# Patient Record
Sex: Female | Born: 1970
Health system: Southern US, Community
[De-identification: ages and names within clinical notes are randomized; demographics above are authoritative.]

## PROBLEM LIST (undated history)

## (undated) DIAGNOSIS — R011 Cardiac murmur, unspecified: Secondary | ICD-10-CM

## (undated) DIAGNOSIS — I890 Lymphedema, not elsewhere classified: Secondary | ICD-10-CM

## (undated) DIAGNOSIS — R609 Edema, unspecified: Secondary | ICD-10-CM

## (undated) DIAGNOSIS — D219 Benign neoplasm of connective and other soft tissue, unspecified: Secondary | ICD-10-CM

## (undated) HISTORY — PX: UTERINE FIBROID SURGERY: SHX826

## (undated) HISTORY — PX: MYOMECTOMY: SHX85

---

## 1998-01-15 ENCOUNTER — Emergency Department (HOSPITAL_COMMUNITY): Admission: EM | Admit: 1998-01-15 | Discharge: 1998-01-15 | Payer: Self-pay | Admitting: Emergency Medicine

## 1998-08-12 ENCOUNTER — Emergency Department (HOSPITAL_COMMUNITY): Admission: EM | Admit: 1998-08-12 | Discharge: 1998-08-12 | Payer: Self-pay | Admitting: Emergency Medicine

## 1998-08-12 ENCOUNTER — Encounter: Payer: Self-pay | Admitting: *Deleted

## 1999-01-08 ENCOUNTER — Emergency Department (HOSPITAL_COMMUNITY): Admission: EM | Admit: 1999-01-08 | Discharge: 1999-01-08 | Payer: Self-pay | Admitting: Emergency Medicine

## 1999-01-09 ENCOUNTER — Encounter: Payer: Self-pay | Admitting: Emergency Medicine

## 2000-11-21 ENCOUNTER — Emergency Department (HOSPITAL_COMMUNITY): Admission: EM | Admit: 2000-11-21 | Discharge: 2000-11-22 | Payer: Self-pay | Admitting: Emergency Medicine

## 2004-04-17 ENCOUNTER — Emergency Department (HOSPITAL_COMMUNITY): Admission: EM | Admit: 2004-04-17 | Discharge: 2004-04-18 | Payer: Self-pay

## 2005-05-02 ENCOUNTER — Encounter (INDEPENDENT_AMBULATORY_CARE_PROVIDER_SITE_OTHER): Payer: Self-pay | Admitting: Specialist

## 2005-05-02 ENCOUNTER — Inpatient Hospital Stay (HOSPITAL_COMMUNITY): Admission: RE | Admit: 2005-05-02 | Discharge: 2005-05-04 | Payer: Self-pay | Admitting: Obstetrics and Gynecology

## 2006-08-19 ENCOUNTER — Emergency Department (HOSPITAL_COMMUNITY): Admission: EM | Admit: 2006-08-19 | Discharge: 2006-08-19 | Payer: Self-pay | Admitting: Family Medicine

## 2008-07-15 ENCOUNTER — Emergency Department (HOSPITAL_COMMUNITY): Admission: EM | Admit: 2008-07-15 | Discharge: 2008-07-15 | Payer: Self-pay | Admitting: Emergency Medicine

## 2009-06-17 ENCOUNTER — Inpatient Hospital Stay (HOSPITAL_COMMUNITY): Admission: AD | Admit: 2009-06-17 | Discharge: 2009-06-17 | Payer: Self-pay | Admitting: Obstetrics and Gynecology

## 2009-06-20 ENCOUNTER — Inpatient Hospital Stay (HOSPITAL_COMMUNITY): Admission: AD | Admit: 2009-06-20 | Discharge: 2009-06-20 | Payer: Self-pay | Admitting: Obstetrics & Gynecology

## 2009-06-24 ENCOUNTER — Inpatient Hospital Stay (HOSPITAL_COMMUNITY): Admission: AD | Admit: 2009-06-24 | Discharge: 2009-06-24 | Payer: Self-pay | Admitting: Obstetrics & Gynecology

## 2009-07-04 ENCOUNTER — Inpatient Hospital Stay (HOSPITAL_COMMUNITY): Admission: AD | Admit: 2009-07-04 | Discharge: 2009-07-05 | Payer: Self-pay | Admitting: Obstetrics and Gynecology

## 2009-10-25 ENCOUNTER — Emergency Department (HOSPITAL_COMMUNITY): Admission: EM | Admit: 2009-10-25 | Discharge: 2009-10-25 | Payer: Self-pay | Admitting: Emergency Medicine

## 2009-10-27 ENCOUNTER — Emergency Department (HOSPITAL_COMMUNITY): Admission: EM | Admit: 2009-10-27 | Discharge: 2009-10-27 | Payer: Self-pay | Admitting: Family Medicine

## 2009-10-27 ENCOUNTER — Emergency Department (HOSPITAL_COMMUNITY): Admission: EM | Admit: 2009-10-27 | Discharge: 2009-10-27 | Payer: Self-pay | Admitting: Emergency Medicine

## 2009-11-01 ENCOUNTER — Emergency Department (HOSPITAL_COMMUNITY): Admission: EM | Admit: 2009-11-01 | Discharge: 2009-11-01 | Payer: Self-pay | Admitting: Family Medicine

## 2010-02-03 ENCOUNTER — Inpatient Hospital Stay (HOSPITAL_COMMUNITY): Admission: AD | Admit: 2010-02-03 | Discharge: 2010-02-03 | Payer: Self-pay | Admitting: Obstetrics & Gynecology

## 2010-11-20 LAB — URINALYSIS, ROUTINE W REFLEX MICROSCOPIC
Bilirubin Urine: NEGATIVE
Ketones, ur: NEGATIVE mg/dL
Nitrite: NEGATIVE
pH: 6 (ref 5.0–8.0)

## 2010-11-22 LAB — POCT I-STAT, CHEM 8
Chloride: 103 mEq/L (ref 96–112)
Glucose, Bld: 105 mg/dL — ABNORMAL HIGH (ref 70–99)
HCT: 43 % (ref 36.0–46.0)
Potassium: 4.3 mEq/L (ref 3.5–5.1)
Sodium: 136 mEq/L (ref 135–145)

## 2010-11-22 LAB — POCT RAPID STREP A (OFFICE): Streptococcus, Group A Screen (Direct): NEGATIVE

## 2010-11-24 LAB — POCT RAPID STREP A (OFFICE): Streptococcus, Group A Screen (Direct): NEGATIVE

## 2010-12-06 LAB — CBC
Hemoglobin: 13.2 g/dL (ref 12.0–15.0)
MCHC: 33.3 g/dL (ref 30.0–36.0)
RBC: 4.59 MIL/uL (ref 3.87–5.11)
WBC: 4.7 10*3/uL (ref 4.0–10.5)

## 2010-12-07 LAB — URINALYSIS, ROUTINE W REFLEX MICROSCOPIC
Nitrite: NEGATIVE
Protein, ur: NEGATIVE mg/dL
Specific Gravity, Urine: 1.02 (ref 1.005–1.030)
Urobilinogen, UA: 0.2 mg/dL (ref 0.0–1.0)

## 2010-12-07 LAB — HCG, QUANTITATIVE, PREGNANCY
hCG, Beta Chain, Quant, S: 5432 m[IU]/mL — ABNORMAL HIGH (ref <5)
hCG, Beta Chain, Quant, S: 5715 m[IU]/mL — ABNORMAL HIGH (ref <5)

## 2010-12-07 LAB — CBC
Hemoglobin: 12.5 g/dL (ref 12.0–15.0)
MCHC: 33.3 g/dL (ref 30.0–36.0)
MCV: 86.5 fL (ref 78.0–100.0)
RBC: 4.33 MIL/uL (ref 3.87–5.11)
WBC: 4.3 10*3/uL (ref 4.0–10.5)

## 2010-12-07 LAB — ABO/RH: ABO/RH(D): AB POS

## 2010-12-07 LAB — POCT PREGNANCY, URINE: Preg Test, Ur: POSITIVE

## 2010-12-07 LAB — GC/CHLAMYDIA PROBE AMP, GENITAL: GC Probe Amp, Genital: NEGATIVE

## 2011-01-19 NOTE — H&P (Signed)
Debra Hutchinson, EISENMENGER NO.:  [id number]   MEDICAL RECORD NO.:  [id number]          PATIENT TYPE:  INP   LOCATION:  NA                            FACILITY:  [facility]   PHYSICIAN:  Lenoard Aden, M.D.DATE OF BIRTH:  12/14/1970   DATE OF ADMISSION:  05/02/2005  DATE OF DISCHARGE:                                HISTORY & PHYSICAL   CHIEF COMPLAINT:  Pelvic pain.   HISTORY OF PRESENT ILLNESS:  The patient is a 40 year old African-American  female, G0, P0, who presents with symptomatic uterine fibroids for abdominal  myomectomy. Ultrasound done previously revealed a 9 x 9 cm anterior uterine  wall fibroid and two smaller fibroids in the fundus measuring 2 x 2 and 1 x  1 cm, bilateral normal ovaries were previously noted. The patient has a  noncontributory pregnancy history.   She has a surgical history remarkable for a lumpectomy of a fibroadenoma in  1987. Family history of hypertension and diabetes. She is a nonsmoker and  nondrinker. Denies domestic or physical violence. Takes no medications and  has no known drug allergies. She has a history of an abnormal Pap smear with  CIN3 status post LEEP with excised CIN3 in June of this year.   PHYSICAL EXAMINATION:  GENERAL:  She is a well-developed, well-nourished  African-American female in no acute distress.  HEENT:  Normal.  LUNGS:  Clear.  HEART:  Regular rate and rhythm.  ABDOMEN:  Soft and nontender.  PELVIC:  Reveals some 10 to 12 week size uterus and no adnexal masses.  EXTREMITIES:  Reveal no cords.  NEUROLOGICAL:  Nonfocal.   IMPRESSION:  Symptomatic uterine fibroid for definitive management.   PLAN:  Proceed with abdominal myomectomy. Risks of anesthesia, infection,  bleeding, injury to abdominal organs, need for repair was discussed and  fertility risks were discussed with secondary adhesions noted. The patient  acknowledges and wishes to proceed.           ______________________________  Lenoard Aden, M.D.     RJT/MEDQ  D:  05/01/2005  T:  05/01/2005  Job:  [id number]

## 2011-01-19 NOTE — Op Note (Signed)
Debra Hutchinson, NEAL NO.:  [id number]   MEDICAL RECORD NO.:  [id number]          PATIENT TYPE:  INP   LOCATION:  9399                          FACILITY:  [facility]   PHYSICIAN:  Lenoard Aden, M.D.DATE OF BIRTH:  [birth year]   DATE OF PROCEDURE:  05/02/2005  DATE OF DISCHARGE:                                 OPERATIVE REPORT   PREOPERATIVE DIAGNOSIS:  Symptomatic uterine fibroids.   POSTOPERATIVE DIAGNOSIS:  Symptomatic uterine fibroids.   PROCEDURES:  1.  Multiple abdominal myomectomy.  2.  Chromopertubation.   SURGEON:  Lenoard Aden, M.D.   ASSISTANT:  Cordelia Pen A. Rosalio Macadamia, M.D.   ANESTHESIA:  General.   ESTIMATED BLOOD LOSS:  300 mL.   COMPLICATIONS:  None.   DRAINS:  Foley.   COUNTS:  Correct.   Patient to recovery in good condition.   BRIEF OPERATIVE NOTE:  After being apprised of the risks of anesthesia,  infection, bleeding, injury to abdominal organs and need for repair, delayed  versus immediate complications to include bowel and bladder injury, the  patient was brought to the operating room, where she was administered a  general anesthetic without complications, prepped and draped in the usual  sterile fashion, a Foley catheter placed, an acorn cannula placed per  vagina.  Exam under anesthesia reveals a 12-14 weeks' size uterus, no  adnexal masses.  At this time a Pfannenstiel skin incision made with a  scalpel, carried down to the fascia, which is nicked in the midline and  opened transversely using Mayo scissors.  Rectus muscles dissected sharply  in the midline, peritoneum entered sharply.  The peritoneal cavity is  entered sharply.  Visualization reveals normal tubes, normal ovaries, and a  large anterior fibroid measuring at least 10-12 cm.  The specimen is then  elevated through the incision.  A dilute Pitressin solution is placed.  A  vertical anterior incision is made over the fibroid, which is then extended  down to the  fibroid capsule.  The fibroid is then dissected sharply in its  entirety and removed in one piece, an approximately 10 cm fibroid.  The  cavity defect is closed using a 2-0 Vicryl and a 3-0 Vicryl, first in a  concentric running fashion and then in an interrupted figure-of-eight  fashion.  A 3-0 Monocryl is used to place a baseball stitch in the usual  fashion.  Good hemostasis is noted.  Then over the posterior wall the  fibroid is identified, Pitressin solution placed, an incision made with  needle-point cautery in a vertical fashion.  The fibroid is removed.  The  defect is closed using a 2-0 Vicryl and a 4-0 Monocryl baseball stitch.  Good hemostasis is noted.  The area is covered with Interceed.  Chromopertubation is then  performed, revealing bilateral tubal spillage and bilateral normal ovaries.  At this time irrigation is accomplished and good hemostasis is noted.  The  fascia is closed using a 0 Monocryl in continuous running fashion.  Skin  closed using staples.  The patient tolerates the procedure well and is  transferred to recovery in good condition.  Lenoard Aden, M.D.  Electronically Signed     RJT/MEDQ  D:  05/02/2005  T:  05/02/2005  Job:  [id number]

## 2011-01-19 NOTE — Discharge Summary (Signed)
Debra Hutchinson, Debra Hutchinson                ACCOUNT NO.:  [id number]   MEDICAL RECORD NO.:  [id number]          PATIENT TYPE:  INP   LOCATION:  9302                          FACILITY:  [facility]   PHYSICIAN:  Lenoard Aden, M.D.DATE OF BIRTH:  11-24-1970   DATE OF ADMISSION:  05/02/2005  DATE OF DISCHARGE:  05/04/2005                                 DISCHARGE SUMMARY   The patient underwent uncomplicated multiple abdominal myomectomy and  chromopertubation on May 02, 2005.  Postoperative course uncomplicated.  Tolerated a regular diet well.  Hemoglobin and hematocrit within normal  limits.  Discharged on day #2.  Percocet and iron given. Follow up in the  office in four to six weeks.  Incision care discussed.  Postoperative course  uncomplicated.      Lenoard Aden, M.D.  Electronically Signed     RJT/MEDQ  D:  07/01/2005  T:  07/02/2005  Job:  [id number]

## 2011-06-26 ENCOUNTER — Inpatient Hospital Stay (INDEPENDENT_AMBULATORY_CARE_PROVIDER_SITE_OTHER)
Admission: RE | Admit: 2011-06-26 | Discharge: 2011-06-26 | Disposition: A | Discharge status: Home / Self Care | Payer: 59 | Source: Ambulatory Visit | Attending: Family Medicine | Admitting: Family Medicine

## 2011-06-26 DIAGNOSIS — R109 Unspecified abdominal pain: Secondary | ICD-10-CM

## 2011-06-26 DIAGNOSIS — J309 Allergic rhinitis, unspecified: Secondary | ICD-10-CM

## 2011-06-26 LAB — POCT URINALYSIS DIP (DEVICE)
Bilirubin Urine: NEGATIVE
Ketones, ur: NEGATIVE mg/dL
Leukocytes, UA: NEGATIVE
Protein, ur: NEGATIVE mg/dL
Specific Gravity, Urine: 1.01 (ref 1.005–1.030)
pH: 7 (ref 5.0–8.0)

## 2011-06-26 LAB — POCT I-STAT, CHEM 8
BUN: 8 mg/dL (ref 6–23)
Calcium, Ion: 1.13 mmol/L (ref 1.12–1.32)
Chloride: 99 mEq/L (ref 96–112)
HCT: 38 % (ref 36.0–46.0)
Potassium: 3.4 mEq/L — ABNORMAL LOW (ref 3.5–5.1)

## 2011-06-26 LAB — POCT PREGNANCY, URINE: Preg Test, Ur: NEGATIVE

## 2011-08-31 ENCOUNTER — Emergency Department (INDEPENDENT_AMBULATORY_CARE_PROVIDER_SITE_OTHER)
Admission: EM | Admit: 2011-08-31 | Discharge: 2011-08-31 | Disposition: A | Discharge status: Home / Self Care | Payer: 59 | Source: Home / Self Care

## 2011-08-31 DIAGNOSIS — J069 Acute upper respiratory infection, unspecified: Secondary | ICD-10-CM

## 2011-08-31 HISTORY — DX: Edema, unspecified: R60.9

## 2011-08-31 HISTORY — DX: Cardiac murmur, unspecified: R01.1

## 2011-08-31 MED ORDER — PROMETHAZINE-CODEINE 6.25-10 MG/5ML PO SYRP: ORAL_SOLUTION | ORAL | Status: AC

## 2011-08-31 NOTE — ED Notes (Signed)
C/o productive cough of yellow sputum, nasal congestion and sore throat for 3 days.  States temp of 99.1 at highest

## 2011-08-31 NOTE — ED Provider Notes (Signed)
History     CSN: [id number]  Arrival date & time 08/31/11  1102   None     Chief Complaint  Patient presents with  . Cough  . Sore Throat    (Consider location/radiation/quality/duration/timing/severity/associated sxs/prior treatment) HPI Comments: Pt states 3 days ago she developed sinus and nasal congestion. She took an over the counter decongestant and an over the counter pain reliever. She has a hx of sinus problems and thought it was starting again. The next day her sinuses were better, but she began coughing and had sore throat and low grade fever. Nasal congestion persists.T Max 99+. Cough is disruptive to sleep. She is taking otc cough medication. She is a Engineer, civil (consulting) at [employer] and states she has had a couple of influenza pts and is concerned she is developing the flu. She denies myalgias,dyspnea or wheezing.   Patient is a 40 y.o. female presenting with pharyngitis. The history is provided by the patient.  Sore Throat Pertinent negatives include no shortness of breath.    Past Medical History  Diagnosis Date  . Heart murmur   . Peripheral edema     Past Surgical History  Procedure Date  . Uterine fibroid surgery     No family history on file.  History  Substance Use Topics  . Smoking status: Never Smoker   . Smokeless tobacco: Not on file  . Alcohol Use: No    OB History    Grav Para Term Preterm Abortions TAB SAB Ect Mult Living                  Review of Systems  Constitutional: Positive for fever and chills.  HENT: Positive for congestion, sore throat and rhinorrhea. Negative for ear pain, postnasal drip and sinus pressure.   Respiratory: Positive for cough. Negative for shortness of breath and wheezing.     Allergies  Review of patient's allergies indicates no known allergies.  Home Medications   Current Outpatient Rx  Name Route Sig Dispense Refill  . PROMETHAZINE-CODEINE 6.25-10 MG/5ML PO SYRP  1-2 tsp every 6 hrs prn cough 120 mL 0    BP  145/91  Pulse 90  Temp(Src) 98.5 F (36.9 C) (Oral)  Resp 20  SpO2 98%  LMP 08/27/2011  Physical Exam  Nursing note and vitals reviewed. Constitutional: She appears well-developed and well-nourished. No distress.  HENT:  Head: Normocephalic and atraumatic.  Right Ear: Tympanic membrane, external ear and ear canal normal.  Left Ear: Tympanic membrane, external ear and ear canal normal.  Nose: Nose normal.  Mouth/Throat: Uvula is midline, oropharynx is clear and moist and mucous membranes are normal. No oropharyngeal exudate, posterior oropharyngeal edema or posterior oropharyngeal erythema.  Neck: Neck supple.  Cardiovascular: Normal rate, regular rhythm and normal heart sounds.   Pulmonary/Chest: Effort normal and breath sounds normal. No respiratory distress.  Lymphadenopathy:    She has no cervical adenopathy.  Neurological: She is alert.  Skin: Skin is warm and dry.  Psychiatric: She has a normal mood and affect.    ED Course  Procedures (including critical care time)  Labs Reviewed - No data to display No results found.   1. Acute URI       MDM  URI symptoms with low grade fever.         Melody Comas, [place] 08/31/11 1228

## 2011-08-31 NOTE — ED Provider Notes (Signed)
Medical screening examination/treatment/procedure(s) were performed by non-physician practitioner and as supervising physician I was immediately available for consultation/collaboration.  Rohaan Durnil   Durrel Mcnee, MD 08/31/11 1430 

## 2012-04-17 ENCOUNTER — Emergency Department (HOSPITAL_COMMUNITY)
Admission: EM | Admit: 2012-04-17 | Discharge: 2012-04-18 | Disposition: A | Discharge status: Home / Self Care | Payer: 59 | Attending: Emergency Medicine | Admitting: Emergency Medicine

## 2012-04-17 ENCOUNTER — Encounter (HOSPITAL_COMMUNITY): Payer: Self-pay | Admitting: Emergency Medicine

## 2012-04-17 DIAGNOSIS — B9689 Other specified bacterial agents as the cause of diseases classified elsewhere: Secondary | ICD-10-CM | POA: Insufficient documentation

## 2012-04-17 DIAGNOSIS — N760 Acute vaginitis: Secondary | ICD-10-CM | POA: Insufficient documentation

## 2012-04-17 DIAGNOSIS — R102 Pelvic and perineal pain: Secondary | ICD-10-CM

## 2012-04-17 DIAGNOSIS — D219 Benign neoplasm of connective and other soft tissue, unspecified: Secondary | ICD-10-CM

## 2012-04-17 DIAGNOSIS — N938 Other specified abnormal uterine and vaginal bleeding: Secondary | ICD-10-CM

## 2012-04-17 DIAGNOSIS — D509 Iron deficiency anemia, unspecified: Secondary | ICD-10-CM | POA: Insufficient documentation

## 2012-04-17 DIAGNOSIS — A499 Bacterial infection, unspecified: Secondary | ICD-10-CM | POA: Insufficient documentation

## 2012-04-17 HISTORY — DX: Lymphedema, not elsewhere classified: I89.0

## 2012-04-17 HISTORY — DX: Benign neoplasm of connective and other soft tissue, unspecified: D21.9

## 2012-04-17 LAB — CBC WITH DIFFERENTIAL/PLATELET
Eosinophils Relative: 4 % (ref 0–5)
Lymphocytes Relative: 32 % (ref 12–46)
Lymphs Abs: 1.4 10*3/uL (ref 0.7–4.0)
MCV: 71.7 fL — ABNORMAL LOW (ref 78.0–100.0)
Neutrophils Relative %: 53 % (ref 43–77)
Platelets: 330 10*3/uL (ref 150–400)
RBC: 4.53 MIL/uL (ref 3.87–5.11)
WBC: 4.4 10*3/uL (ref 4.0–10.5)

## 2012-04-17 NOTE — ED Notes (Signed)
PT. REPORTS LLQ PAIN WITH SLIGHT NAUSEA/ CHILLS ONSET LAST Wednesday , DENIES VOMITTING OR DIARRHEA. NO FEVER . PT. STATES 2ND DAY OF HER MENSTRUAL PERIOD. PT. ADDED HISTORY OF FIBROIDS / MYOMECTOMY.

## 2012-04-18 ENCOUNTER — Emergency Department (HOSPITAL_COMMUNITY): Payer: 59

## 2012-04-18 LAB — WET PREP, GENITAL
Trich, Wet Prep: NONE SEEN
Yeast Wet Prep HPF POC: NONE SEEN

## 2012-04-18 LAB — BASIC METABOLIC PANEL
CO2: 28 mEq/L (ref 19–32)
Chloride: 104 mEq/L (ref 96–112)
Glucose, Bld: 91 mg/dL (ref 70–99)
Potassium: 3.6 mEq/L (ref 3.5–5.1)
Sodium: 141 mEq/L (ref 135–145)

## 2012-04-18 LAB — URINALYSIS, ROUTINE W REFLEX MICROSCOPIC
Glucose, UA: NEGATIVE mg/dL
Hgb urine dipstick: NEGATIVE
Specific Gravity, Urine: 1.039 — ABNORMAL HIGH (ref 1.005–1.030)
Urobilinogen, UA: 1 mg/dL (ref 0.0–1.0)

## 2012-04-18 MED ORDER — METRONIDAZOLE 500 MG PO TABS: 500.0000 mg | ORAL_TABLET | Freq: Two times a day (BID) | ORAL | Status: AC

## 2012-04-18 MED ORDER — HYDROCODONE-ACETAMINOPHEN 5-325 MG PO TABS
2.0000 | ORAL_TABLET | Freq: Once | ORAL | Status: AC
  Administered 2012-04-18: 2 via ORAL
  Filled 2012-04-18: qty 2

## 2012-04-18 MED ORDER — HYDROCODONE-ACETAMINOPHEN 5-500 MG PO TABS: 1.0000 | ORAL_TABLET | Freq: Four times a day (QID) | ORAL | Status: AC | PRN

## 2012-04-18 NOTE — ED Notes (Signed)
Patient has returned from ultrasound.

## 2012-04-18 NOTE — ED Notes (Signed)
Patient transported to Ultrasound 

## 2012-04-18 NOTE — ED Provider Notes (Signed)
History     CSN: [id number]  Arrival date & time 04/17/12  2314   First MD Initiated Contact with Patient 04/18/12 0028      Chief Complaint  Patient presents with  . Abdominal Pain    (Consider location/radiation/quality/duration/timing/severity/associated sxs/prior treatment) Patient is a 41 y.o. female presenting with abdominal pain. The history is provided by the patient.  Abdominal Pain The primary symptoms of the illness include abdominal pain and vaginal bleeding. The primary symptoms of the illness do not include fever, shortness of breath, dysuria or vaginal discharge.  pt c/o 'heavy period' on day 2, changing tampon q 2 hrs.  Also w lower abd cramping, low back cramping c/w prior periods. Hx uterine fibroids, was told needed surgery for same. No hx ovarian cyst or endometriosis. No hx diverticula. No vaginal discharge. No dysuria or urgency. No fever or chills. No abd distension. Normal appetite. No nv. Having normal bms, no constipation or diarrhea.   Past Medical History  Diagnosis Date  . Heart murmur   . Peripheral edema   . Lymphedema   . Fibroids     Past Surgical History  Procedure Date  . Uterine fibroid surgery   . Myomectomy     No family history on file.  History  Substance Use Topics  . Smoking status: Never Smoker   . Smokeless tobacco: Not on file  . Alcohol Use: No    OB History    Grav Para Term Preterm Abortions TAB SAB Ect Mult Living                  Review of Systems  Constitutional: Negative for fever.  Respiratory: Negative for shortness of breath.   Gastrointestinal: Positive for abdominal pain.  Genitourinary: Positive for vaginal bleeding. Negative for dysuria and vaginal discharge.  Neurological: Negative for syncope and light-headedness.    Allergies  Review of patient's allergies indicates no known allergies.  Home Medications   Current Outpatient Rx  Name Route Sig Dispense Refill  . FERROUS SULFATE 325 (65 FE) MG  PO TABS Oral Take 325 mg by mouth daily with breakfast.    . IBUPROFEN 200 MG PO TABS Oral Take 800 mg by mouth every 6 (six) hours as needed. For pain      BP 133/87  Pulse 67  Temp 98.1 F (36.7 C) (Oral)  Resp 16  SpO2 100%  LMP 04/15/2012  Physical Exam  Nursing note and vitals reviewed. Constitutional: She is oriented to person, place, and time. She appears well-developed and well-nourished. No distress.  Eyes: Conjunctivae are normal. No scleral icterus.  Neck: Neck supple. No tracheal deviation present.  Cardiovascular: Normal rate.   Pulmonary/Chest: Effort normal. No respiratory distress.  Abdominal: Soft. Normal appearance and bowel sounds are normal. She exhibits no distension and no mass. There is no tenderness. There is no rebound and no guarding.  Genitourinary:       No cva tenderness. Scant amount dark blood in vagina. No active bleeding. Cervix closed. No cmt. Uterus enlarged, ?fibroids.   Musculoskeletal: She exhibits no edema.  Neurological: She is alert and oriented to person, place, and time.  Skin: Skin is warm and dry. No rash noted. No pallor.  Psychiatric: She has a normal mood and affect.    ED Course  Procedures (including critical care time)  Results for orders placed during the hospital encounter of 04/17/12  URINALYSIS, ROUTINE W REFLEX MICROSCOPIC      Component Value Range  Color, Urine YELLOW  YELLOW   APPearance CLEAR  CLEAR   Specific Gravity, Urine 1.039 (*) 1.005 - 1.030   pH 5.5  5.0 - 8.0   Glucose, UA NEGATIVE  NEGATIVE mg/dL   Hgb urine dipstick NEGATIVE  NEGATIVE   Bilirubin Urine NEGATIVE  NEGATIVE   Ketones, ur NEGATIVE  NEGATIVE mg/dL   Protein, ur NEGATIVE  NEGATIVE mg/dL   Urobilinogen, UA 1.0  0.0 - 1.0 mg/dL   Nitrite NEGATIVE  NEGATIVE   Leukocytes, UA NEGATIVE  NEGATIVE  CBC WITH DIFFERENTIAL      Component Value Range   WBC 4.4  4.0 - 10.5 K/uL   RBC 4.53  3.87 - 5.11 MIL/uL   Hemoglobin 10.3 (*) 12.0 - 15.0 g/dL     HCT 16.1 (*) 09.6 - 46.0 %   MCV 71.7 (*) 78.0 - 100.0 fL   MCH 22.7 (*) 26.0 - 34.0 pg   MCHC 31.7  30.0 - 36.0 g/dL   RDW 04.5  40.9 - 81.1 %   Platelets 330  150 - 400 K/uL   Neutrophils Relative 53  43 - 77 %   Neutro Abs 2.3  1.7 - 7.7 K/uL   Lymphocytes Relative 32  12 - 46 %   Lymphs Abs 1.4  0.7 - 4.0 K/uL   Monocytes Relative 11  3 - 12 %   Monocytes Absolute 0.5  0.1 - 1.0 K/uL   Eosinophils Relative 4  0 - 5 %   Eosinophils Absolute 0.2  0.0 - 0.7 K/uL   Basophils Relative 1  0 - 1 %   Basophils Absolute 0.0  0.0 - 0.1 K/uL  BASIC METABOLIC PANEL      Component Value Range   Sodium 141  135 - 145 mEq/L   Potassium 3.6  3.5 - 5.1 mEq/L   Chloride 104  96 - 112 mEq/L   CO2 28  19 - 32 mEq/L   Glucose, Bld 91  70 - 99 mg/dL   BUN 11  6 - 23 mg/dL   Creatinine, Ser 9.14  0.50 - 1.10 mg/dL   Calcium 8.9  8.4 - 78.2 mg/dL   GFR calc non Af Amer >90  >90 mL/min   GFR calc Af Amer >90  >90 mL/min  POCT PREGNANCY, URINE      Component Value Range   Preg Test, Ur NEGATIVE  NEGATIVE  WET PREP, GENITAL      Component Value Range   Yeast Wet Prep HPF POC NONE SEEN  NONE SEEN   Trich, Wet Prep NONE SEEN  NONE SEEN   Clue Cells Wet Prep HPF POC FEW (*) NONE SEEN   WBC, Wet Prep HPF POC FEW (*) NONE SEEN   US Pelvis Complete  04/18/2012  *RADIOLOGY REPORT*  Clinical Data: Left pelvic pain.  Ovarian cyst versus fibroid.  TRANSABDOMINAL ULTRASOUND OF PELVIS  Technique:  Transabdominal ultrasound examination of the pelvis was performed including evaluation of the uterus, ovaries, adnexal regions, and pelvic cul-de-sac.  Comparison:  None.  Findings:  Uterus:  16.3 cm x 12 cm x 10.7 cm.  Multiple uterine fibroids are present.  The largest are fundal in location, ranging in size from fibroids are fundal in location.  Multiple fibroids range up to 6.8 cm in size.  Endometrium: Not visualized.  Right ovary: Not visualized due to the enlarged uterus and fibroids.  Left ovary: Not  visualized due to enlarged uterus and fibroids.  Other Findings:  Transvaginal  exam was not performed due to pelvic pain.  IMPRESSION: Fibroid uterus with multiple large fibroids that produce suboptimal sonographic evaluation of the anatomic pelvis.  No free fluid identified.  Original Report Authenticated By: Andreas Newport, M.D.        MDM  Labs.   Mild anemia. Microcystic. Pt on iron.   States going through tampon q 2 hours, c/w other prior recent heavy periods.  rec gyn follow up.   Discussed u/s w pt. abd soft nt on recheck.   Discussed need close gyn follow up - pt states has seen Dr Rosemary Holms in past and will follow up there.  No heavy/recurrent bleeding in ed.   Pt does note mild vaginal discharge recently in between periods/vaginal bleeding.  Clue cells on wet prep, will rx bv.       Suzi Roots, MD 04/18/12 [phone]

## 2012-04-19 LAB — GC/CHLAMYDIA PROBE AMP, GENITAL: GC Probe Amp, Genital: NEGATIVE

## 2012-06-13 ENCOUNTER — Emergency Department (HOSPITAL_COMMUNITY)
Admission: EM | Admit: 2012-06-13 | Discharge: 2012-06-13 | Disposition: A | Discharge status: Home / Self Care | Payer: 59 | Source: Home / Self Care

## 2012-06-13 ENCOUNTER — Encounter (HOSPITAL_COMMUNITY): Payer: Self-pay | Admitting: *Deleted

## 2012-06-13 DIAGNOSIS — R52 Pain, unspecified: Secondary | ICD-10-CM

## 2012-06-13 DIAGNOSIS — R3129 Other microscopic hematuria: Secondary | ICD-10-CM

## 2012-06-13 DIAGNOSIS — M549 Dorsalgia, unspecified: Secondary | ICD-10-CM

## 2012-06-13 DIAGNOSIS — R109 Unspecified abdominal pain: Secondary | ICD-10-CM

## 2012-06-13 LAB — POCT URINALYSIS DIP (DEVICE)
Leukocytes, UA: NEGATIVE
Nitrite: NEGATIVE
Protein, ur: NEGATIVE mg/dL
pH: 6 (ref 5.0–8.0)

## 2012-06-13 MED ORDER — ONDANSETRON HCL 4 MG/2ML IJ SOLN
4.0000 mg | Freq: Once | INTRAMUSCULAR | Status: AC
  Administered 2012-06-13: 4 mg via INTRAMUSCULAR

## 2012-06-13 MED ORDER — ONDANSETRON HCL 4 MG/2ML IJ SOLN
INTRAMUSCULAR | Status: AC
  Filled 2012-06-13: qty 2

## 2012-06-13 NOTE — ED Notes (Signed)
Pt  Reports  r  Flank  Pain   With  Nausea     Since  2   Am         Not  releived  By  otc  meds         She   Reports the  Pain is  Between  8-10    Pain  Scale        She  denys  Any  History    She  denys  Any  Recent  Injury

## 2012-06-13 NOTE — ED Provider Notes (Signed)
History     CSN: [id number]  Arrival date & time 06/13/12  4098   None     Chief Complaint  Patient presents with  . 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 The pain is progressed but not radiating. It is located her right flank/right low back. She feels like there is a fist to her back. she tends to pace with the pain. Associated symptoms include nausea. Urine is trace microscopic hematuria. She does not have a history of kidney stones.   Patient is a 41 y.o. female presenting with flank pain.  Flank Pain Pertinent negatives include no abdominal pain.    Past Medical History  Diagnosis Date  . Heart murmur   . Peripheral edema   . Lymphedema   . Fibroids     Past Surgical History  Procedure Date  . Uterine fibroid surgery   . Myomectomy     No family history on file.  History  Substance Use Topics  . Smoking status: Never Smoker   . Smokeless tobacco: Not on file  . Alcohol Use: No    OB History    Grav Para Term Preterm Abortions TAB SAB Ect Mult Living                  Review of Systems  Constitutional: Negative for fever, diaphoresis and activity change.  HENT: Negative.   Respiratory: Negative.   Cardiovascular: Negative.   Gastrointestinal: Positive for nausea. Negative for vomiting and abdominal pain.  Genitourinary: Positive for flank pain. Negative for dysuria, urgency, decreased urine volume, vaginal pain, menstrual problem and pelvic pain.  Musculoskeletal: Positive for back pain. Negative for gait problem.  Neurological: Negative.     Allergies  Review of patient's allergies indicates no known allergies.  Home Medications   Current Outpatient Rx  Name Route Sig Dispense Refill  . ACETAMINOPHEN 325 MG PO TABS Oral Take 650 mg by  mouth every 6 (six) hours as needed.    Marland Kitchen FERROUS SULFATE 325 (65 FE) MG PO TABS Oral Take 325 mg by mouth daily with breakfast.    . IBUPROFEN 200 MG PO TABS Oral Take 800 mg by mouth every 6 (six) hours as needed. For pain      BP 150/83  Pulse 76  Temp 98.3 F (36.8 C) (Oral)  Resp 20  SpO2 100%  LMP 06/13/2012  Physical Exam  Constitutional: She is oriented to person, place, and time. She appears well-developed and well-nourished.  Eyes: Conjunctivae normal and EOM are normal.  Neck: Normal range of motion. Neck supple.  Cardiovascular: Normal rate, regular rhythm and normal heart sounds.   Pulmonary/Chest: Effort normal and breath sounds normal.  Abdominal: Soft. There is no tenderness.  Musculoskeletal: She exhibits no edema.       Marked tenderness in a single-point of the right lower back musculature.  Neurological: She is alert and oriented to person, place, and time. No cranial nerve deficit.  Skin: Skin is warm and dry.    ED Course  Procedures (including critical care time)  Labs Reviewed  POCT URINALYSIS DIP (DEVICE) - Abnormal; Notable for the following:    Hgb urine dipstick TRACE (*)     All other components within normal limits  POCT PREGNANCY, URINE   No results found.   1. Flank pain, acute  2. Hematuria, microscopic   3. Back pain       MDM   Results for orders placed during the hospital encounter of 06/13/12  POCT URINALYSIS DIP (DEVICE)      Component Value Range   Glucose, UA NEGATIVE  NEGATIVE mg/dL   Bilirubin Urine NEGATIVE  NEGATIVE   Ketones, ur NEGATIVE  NEGATIVE mg/dL   Specific Gravity, Urine 1.015  1.005 - 1.030   Hgb urine dipstick TRACE (*) NEGATIVE   pH 6.0  5.0 - 8.0   Protein, ur NEGATIVE  NEGATIVE mg/dL   Urobilinogen, UA 0.2  0.0 - 1.0 mg/dL   Nitrite NEGATIVE  NEGATIVE   Leukocytes, UA NEGATIVE  NEGATIVE  POCT PREGNANCY, URINE      Component Value Range   Preg Test, Ur NEGATIVE  NEGATIVE   The type of pain that  she has her right flank is similar to that of a colicky pain due to ureteral calculus. However, she does have a choline size area of marked right low back muscle tenderness, which confuses the diagnosis somewhat. Of course, there could be 2 conditions concomitantly. A CT urogram would be best diagnostic tool at this point. She does not want to go to the emergency department. Arrangements have been made to [facility] for her to go there at 2 PM. She has hydrocodone for pain at home but it makes her nauseated so I will give her an injection of Zofran 4 mg IM now and that she revealed to tolerate the Vicodin. Her husband will take her to the urologist this afternoon. Toradol was considered however if this is a stone and it is greater than 7 mm Toradol would be contraindicated.        Hayden Rasmussen, NP 06/13/12 [phone]

## 2012-06-14 NOTE — ED Provider Notes (Signed)
Medical screening examination/treatment/procedure(s) were performed by resident physician or non-physician practitioner and as supervising physician I was immediately available for consultation/collaboration.   Barkley Bruns MD.    Linna Hoff, MD 06/14/12 [phone]

## 2012-07-02 ENCOUNTER — Other Ambulatory Visit: Payer: Self-pay | Admitting: Obstetrics and Gynecology

## 2012-07-02 DIAGNOSIS — D649 Anemia, unspecified: Secondary | ICD-10-CM

## 2012-07-02 DIAGNOSIS — D259 Leiomyoma of uterus, unspecified: Secondary | ICD-10-CM

## 2012-07-02 DIAGNOSIS — N946 Dysmenorrhea, unspecified: Secondary | ICD-10-CM

## 2012-07-08 ENCOUNTER — Other Ambulatory Visit: Payer: 59

## 2013-06-26 ENCOUNTER — Encounter (HOSPITAL_COMMUNITY): Payer: Self-pay | Admitting: Emergency Medicine

## 2013-06-26 ENCOUNTER — Emergency Department (HOSPITAL_COMMUNITY)
Admission: EM | Admit: 2013-06-26 | Discharge: 2013-06-26 | Disposition: A | Discharge status: Home / Self Care | Payer: 59 | Source: Home / Self Care | Attending: Family Medicine | Admitting: Family Medicine

## 2013-06-26 ENCOUNTER — Ambulatory Visit
Admission: RE | Admit: 2013-06-26 | Discharge: 2013-06-26 | Disposition: A | Discharge status: Home / Self Care | Payer: 59 | Source: Ambulatory Visit | Attending: Family Medicine | Admitting: Family Medicine

## 2013-06-26 DIAGNOSIS — N920 Excessive and frequent menstruation with regular cycle: Secondary | ICD-10-CM

## 2013-06-26 DIAGNOSIS — M7989 Other specified soft tissue disorders: Secondary | ICD-10-CM

## 2013-06-26 DIAGNOSIS — D509 Iron deficiency anemia, unspecified: Secondary | ICD-10-CM

## 2013-06-26 LAB — POCT I-STAT, CHEM 8
Calcium, Ion: 1.09 mmol/L — ABNORMAL LOW (ref 1.12–1.23)
Chloride: 104 mEq/L (ref 96–112)
HCT: 26 % — ABNORMAL LOW (ref 36.0–46.0)
Hemoglobin: 8.8 g/dL — ABNORMAL LOW (ref 12.0–15.0)
Potassium: 3.6 mEq/L (ref 3.5–5.1)

## 2013-06-26 LAB — CBC
MCH: 16.7 pg — ABNORMAL LOW (ref 26.0–34.0)
MCHC: 28.3 g/dL — ABNORMAL LOW (ref 30.0–36.0)
Platelets: 337 10*3/uL (ref 150–400)

## 2013-06-26 LAB — IRON AND TIBC: Iron: <10 ug/dL — ABNORMAL LOW (ref 42–135)

## 2013-06-26 MED ORDER — FUROSEMIDE 20 MG PO TABS: 20.0000 mg | ORAL_TABLET | Freq: Every day | ORAL | Status: AC

## 2013-06-26 MED ORDER — DOXYCYCLINE HYCLATE 100 MG PO CAPS: 100.0000 mg | ORAL_CAPSULE | Freq: Two times a day (BID) | ORAL | Status: DC

## 2013-06-26 NOTE — ED Notes (Signed)
C/o lower calf pain left leg. Started off as vericose vein but has gradually gotten worse. Pain and swelling. Tender to touch.  Used ibuprofen with no relief. No hx of DVTS.  Hx of chronic swelling/edema.  Request hgb check due to heavy cylcles also feels light headed at times.

## 2013-06-26 NOTE — ED Provider Notes (Signed)
Debra Hutchinson is a 42 y.o. female who presents to Urgent Care today for  1) left lower leg pain and swelling. Patient has a history of bilateral lower extremity swelling worse with prolonged standing and better with rest. However over the past several weeks she's noted a mildly tender spot on the medial calf. He became more indurated and more painful over the past week. She notes he became moderately painful recently. She denies any fevers or chills or chest pains or trouble breathing. She's not tried any medications. No history of DVT or recent immobility.   2) anemia: Patient suspects that she has anemia as she has heavy menstrual bleeding. Her periods typically last 10 days and she has used to pain positive time and a pad and has to change them every 2-3 hours. She is currently seeing an OB/GYN Dr. for consideration of endometrial ablation versus hysterectomy. She would like to know her hemoglobin as is she suspect she may be anemic. She is taking iron daily.     Past Medical History  Diagnosis Date  . Heart murmur   . Peripheral edema   . Lymphedema   . Fibroids    History  Substance Use Topics  . Smoking status: Never Smoker   . Smokeless tobacco: Not on file  . Alcohol Use: No   ROS as above Medications reviewed. No current facility-administered medications for this encounter.   Current Outpatient Prescriptions  Medication Sig Dispense Refill  . ibuprofen (ADVIL,MOTRIN) 200 MG tablet Take 800 mg by mouth every 6 (six) hours as needed. For pain      . acetaminophen (TYLENOL) 325 MG tablet Take 650 mg by mouth every 6 (six) hours as needed.      . doxycycline (VIBRAMYCIN) 100 MG capsule Take 1 capsule (100 mg total) by mouth 2 (two) times daily.  20 capsule  0  . ferrous sulfate 325 (65 FE) MG tablet Take 325 mg by mouth daily with breakfast.      . furosemide (LASIX) 20 MG tablet Take 1 tablet (20 mg total) by mouth daily.  30 tablet  1    Exam:  BP 126/73  Pulse 75   Temp(Src) 98.2 F (36.8 C) (Oral)  Resp 18  SpO2 100%  LMP 06/22/2013 Gen: Well NAD HEENT: EOMI,  MMM Lungs: CTABL Nl WOB Heart: RRR no MRG Abd: NABS, NT, ND Left lower extremity: 1+ pitting edema. Area of induration and tenderness approximately 3 cm in diameter at the medial aspect of her mid tibia.  There is a small area of fluctuance with mild tenderness in the center of induration.  Right lower extremity 1+ pitting edema Calf diameters are 48 cm bilaterally  Results for orders placed during the hospital encounter of 06/26/13 (from the past 24 hour(s))  POCT I-STAT, CHEM 8     Status: Abnormal   Collection Time    06/26/13  9:30 AM      Result Value Range   Sodium 140  135 - 145 mEq/L   Potassium 3.6  3.5 - 5.1 mEq/L   Chloride 104  96 - 112 mEq/L   BUN 18  6 - 23 mg/dL   Creatinine, Ser 1.61  0.50 - 1.10 mg/dL   Glucose, Bld 096 (*) 70 - 99 mg/dL   Calcium, Ion 0.45 (*) 1.12 - 1.23 mmol/L   TCO2 21  0 - 100 mmol/L   Hemoglobin 8.8 (*) 12.0 - 15.0 g/dL   HCT 40.9 (*) 81.1 - 91.4 %  Attempted aspiration and drainage of possible abscess :  Consent obtained  Area cleaned with alcohol cold spray applied and 2 mL of lidocaine were injected into the area of fluctuance.  A 21-gauge needle was used to aspirate frank blood.  The procedure was discontinued at this time .  Limited musculoskeletal ultrasound of the left lower leg:  Ultrasound probe placed over area of fluctuance. A 1 cm cystic structure was seen. No blood flow noted on Doppler. The indurated tissue surrounding the area of fluctuance was ultrasounded and found to be marbled appearance consistent with cellulitis or edema.  The ultrasound probe was then placed in a posterior knee and compressible veins were identified.  No bony abnormality.    Assessment and Plan: 42 y.o. female with  1) left lower leg swelling and tenderness:  Patient has bilateral dependent edema which will be discussed below.  The area that  initially file is an abscess appears to be a varicose vein. Frank blood was aspirated. I am culturing the blood to ensure no cellulitis and am treating with doxycycline empirically.  Additionally I will obtain a left lower extremity Doppler ultrasound to evaluate for DVT.  Will call patient with result.  2) bilateral lower extremity edema: Likely dependent edema. Patient has a normal creatinine. We'll start low-dose Lasix. Additionally patient will followup with her primary care provider. She may benefit from echocardiogram in the near future.  3) anemia: Patient has hemoglobin of 8.8 with a history of heavy menstrual bleeding. Lab findings to assess iron stores are pending. Plan to increase oral iron to 3 times daily if tolerated.  4) heavy menstrual bleeding: Followup with OB/GYN ASAP  Discussed warning signs or symptoms. Please see discharge instructions. Patient expresses understanding.      Rodolph Bong, MD 06/26/13 1002

## 2013-06-28 ENCOUNTER — Telehealth (HOSPITAL_COMMUNITY): Payer: Self-pay | Admitting: Family Medicine

## 2013-06-28 NOTE — ED Notes (Signed)
Called patient regarding her iron studies.  She is extremely iron deficient.  Plan: Start oral iron tid with Vit c ASAP.  I will investigate IV iron today and will call patient back with plan to start IV iron this week.   Rodolph Bong, MD 06/28/13 [phone]

## 2013-06-28 NOTE — Telephone Encounter (Signed)
Message copied by Rodolph Bong on Sun Jun 28, 2013  8:54 AM ------      Message from: Vassie Moselle      Created: Fri Jun 26, 2013  5:12 PM      Regarding: labs       Iron abnormal.      ----- Message -----         From: Lab In [place] Interface         Sent: 06/26/2013   9:56 AM           To: [facility] Follow Up                   ------

## 2013-06-28 NOTE — Telephone Encounter (Signed)
Message copied by Aleiah Mohammed S on Sun Jun 28, 2013  8:54 AM ------      Message from: YORK, SUZANNE M      Created: Fri Jun 26, 2013  5:12 PM      Regarding: labs       Iron abnormal.      ----- Message -----         From: Lab In [facility] Interface         Sent: 06/26/2013   9:56 AM           To: [facility] Follow Up                   ------ 

## 2013-06-29 LAB — CULTURE, ROUTINE-ABSCESS: Gram Stain: NONE SEEN

## 2013-06-30 ENCOUNTER — Encounter (HOSPITAL_COMMUNITY): Payer: Self-pay | Admitting: Emergency Medicine

## 2013-06-30 ENCOUNTER — Telehealth (HOSPITAL_COMMUNITY): Payer: Self-pay | Admitting: Family Medicine

## 2013-06-30 ENCOUNTER — Emergency Department (HOSPITAL_COMMUNITY)
Admission: EM | Admit: 2013-06-30 | Discharge: 2013-07-01 | Disposition: A | Discharge status: Home / Self Care | Payer: 59 | Attending: Emergency Medicine | Admitting: Emergency Medicine

## 2013-06-30 DIAGNOSIS — R0602 Shortness of breath: Secondary | ICD-10-CM | POA: Insufficient documentation

## 2013-06-30 DIAGNOSIS — D649 Anemia, unspecified: Secondary | ICD-10-CM | POA: Insufficient documentation

## 2013-06-30 DIAGNOSIS — R55 Syncope and collapse: Secondary | ICD-10-CM | POA: Insufficient documentation

## 2013-06-30 DIAGNOSIS — M7989 Other specified soft tissue disorders: Secondary | ICD-10-CM | POA: Insufficient documentation

## 2013-06-30 DIAGNOSIS — R531 Weakness: Secondary | ICD-10-CM

## 2013-06-30 DIAGNOSIS — Z79899 Other long term (current) drug therapy: Secondary | ICD-10-CM | POA: Insufficient documentation

## 2013-06-30 DIAGNOSIS — R002 Palpitations: Secondary | ICD-10-CM | POA: Insufficient documentation

## 2013-06-30 DIAGNOSIS — E876 Hypokalemia: Secondary | ICD-10-CM | POA: Insufficient documentation

## 2013-06-30 DIAGNOSIS — R011 Cardiac murmur, unspecified: Secondary | ICD-10-CM | POA: Insufficient documentation

## 2013-06-30 DIAGNOSIS — Z8679 Personal history of other diseases of the circulatory system: Secondary | ICD-10-CM | POA: Insufficient documentation

## 2013-06-30 DIAGNOSIS — Z792 Long term (current) use of antibiotics: Secondary | ICD-10-CM | POA: Insufficient documentation

## 2013-06-30 DIAGNOSIS — Z8742 Personal history of other diseases of the female genital tract: Secondary | ICD-10-CM | POA: Insufficient documentation

## 2013-06-30 DIAGNOSIS — R42 Dizziness and giddiness: Secondary | ICD-10-CM | POA: Insufficient documentation

## 2013-06-30 LAB — POCT I-STAT, CHEM 8
BUN: 19 mg/dL (ref 6–23)
Calcium, Ion: 1.19 mmol/L (ref 1.12–1.23)
Chloride: 101 mEq/L (ref 96–112)
Creatinine, Ser: 1 mg/dL (ref 0.50–1.10)
Glucose, Bld: 98 mg/dL (ref 70–99)
Sodium: 142 mEq/L (ref 135–145)
TCO2: 25 mmol/L (ref 0–100)

## 2013-06-30 LAB — CBC WITH DIFFERENTIAL/PLATELET
Basophils Absolute: 0.1 10*3/uL (ref 0.0–0.1)
Basophils Relative: 2 % — ABNORMAL HIGH (ref 0–1)
Eosinophils Absolute: 0.1 10*3/uL (ref 0.0–0.7)
Eosinophils Relative: 4 % (ref 0–5)
HCT: 27 % — ABNORMAL LOW (ref 36.0–46.0)
Hemoglobin: 7.8 g/dL — ABNORMAL LOW (ref 12.0–15.0)
MCH: 16.7 pg — ABNORMAL LOW (ref 26.0–34.0)
MCHC: 28.9 g/dL — ABNORMAL LOW (ref 30.0–36.0)
MCV: 57.8 fL — ABNORMAL LOW (ref 78.0–100.0)
Monocytes Absolute: 0.5 10*3/uL (ref 0.1–1.0)
Monocytes Relative: 14 % — ABNORMAL HIGH (ref 3–12)
RDW: 19.4 % — ABNORMAL HIGH (ref 11.5–15.5)

## 2013-06-30 LAB — BASIC METABOLIC PANEL
BUN: 19 mg/dL (ref 6–23)
CO2: 26 mEq/L (ref 19–32)
Calcium: 9.3 mg/dL (ref 8.4–10.5)
Chloride: 100 mEq/L (ref 96–112)
Creatinine, Ser: 0.81 mg/dL (ref 0.50–1.10)
GFR calc Af Amer: >90 mL/min (ref >90)
Glucose, Bld: 92 mg/dL (ref 70–99)

## 2013-06-30 MED ORDER — SODIUM CHLORIDE 0.9 % IV BOLUS (SEPSIS)
1000.0000 mL | Freq: Once | INTRAVENOUS | Status: AC
  Administered 2013-06-30: 1000 mL via INTRAVENOUS

## 2013-06-30 MED ORDER — POTASSIUM CHLORIDE CRYS ER 20 MEQ PO TBCR
10.0000 meq | EXTENDED_RELEASE_TABLET | Freq: Once | ORAL | Status: AC
  Administered 2013-06-30: 10 meq via ORAL
  Filled 2013-06-30: qty 0.5

## 2013-06-30 MED ORDER — POTASSIUM CHLORIDE 10 MEQ/100ML IV SOLN
10.0000 meq | Freq: Once | INTRAVENOUS | Status: AC
  Administered 2013-06-30: 10 meq via INTRAVENOUS
  Filled 2013-06-30: qty 100

## 2013-06-30 NOTE — ED Provider Notes (Signed)
CSN: [id number]     Arrival date & time 06/30/13  1735 History   First MD Initiated Contact with Patient 06/30/13 1839     Chief Complaint  Patient presents with  . Near Syncope  . Anemia   (Consider location/radiation/quality/duration/timing/severity/associated sxs/prior Treatment) HPI Patient is a 42 year old female who presents to emergency department for complaint of dizziness, weakness, near syncopal episode. Patient also states that she is having some shortness of breath and palpitations. Patient states she was diagnosed with anemia several days ago while at urgent care where she was told her hemoglobin level is 7. Patient is going to be set up for outpatient transfusion and iron transfusion however this has not been done yet. Patient states that gradually her symptoms is worsening and she states that now she has had several near syncopal episode. States these episodes are mainly when she's standing up from sitting position. Denies complete loss of consciousness at any point. She denies any chest pain. States she gets tired and weak on exertion. Patient states that she has very heavy menstrual periods and she is set up to see someone regarding this. Patient states that she was also started on Lasix last week for dependent edema which she has had for "years" states that she feels like her dizziness and her symptoms worsened since starting on Lasix. Patient states she has been taking Lasix for a week, 20 mg daily.  Past Medical History  Diagnosis Date  . Heart murmur   . Peripheral edema   . Lymphedema   . Fibroids    Past Surgical History  Procedure Laterality Date  . Uterine fibroid surgery    . Myomectomy     History reviewed. No pertinent family history. History  Substance Use Topics  . Smoking status: Never Smoker   . Smokeless tobacco: Not on file  . Alcohol Use: No   OB History   Grav Para Term Preterm Abortions TAB SAB Ect Mult Living                 Review of Systems   Constitutional: Positive for fatigue. Negative for fever and chills.  Respiratory: Positive for shortness of breath. Negative for cough and chest tightness.   Cardiovascular: Positive for palpitations and leg swelling. Negative for chest pain.  Gastrointestinal: Negative for nausea, vomiting, abdominal pain and diarrhea.  Genitourinary: Negative for dysuria, flank pain, vaginal bleeding, vaginal discharge, vaginal pain and pelvic pain.  Musculoskeletal: Negative for arthralgias, myalgias, neck pain and neck stiffness.  Skin: Negative for rash.  Neurological: Positive for dizziness, weakness and light-headedness. Negative for headaches.  All other systems reviewed and are negative.    Allergies  Review of patient's allergies indicates no known allergies.  Home Medications   Current Outpatient Rx  Name  Route  Sig  Dispense  Refill  . albuterol (PROVENTIL HFA;VENTOLIN HFA) 108 (90 BASE) MCG/ACT inhaler   Inhalation   Inhale 2 puffs into the lungs every 6 (six) hours as needed for shortness of breath.         . doxycycline (VIBRAMYCIN) 100 MG capsule   Oral   Take 100 mg by mouth 2 (two) times daily. Patient started on 06-26-13         . ferrous sulfate 325 (65 FE) MG tablet   Oral   Take 325 mg by mouth daily with breakfast.         . furosemide (LASIX) 20 MG tablet   Oral   Take 1 tablet (20  mg total) by mouth daily.   30 tablet   1   . ibuprofen (ADVIL,MOTRIN) 800 MG tablet   Oral   Take 800 mg by mouth every 8 (eight) hours as needed for pain.          BP 118/77  Pulse 78  Temp(Src) 99.3 F (37.4 C) (Oral)  Resp 20  Wt 232 lb 5 oz (105.376 kg)  SpO2 100%  LMP 06/30/2013 Physical Exam  Nursing note and vitals reviewed. Constitutional: She is oriented to person, place, and time. She appears well-developed and well-nourished. No distress.  HENT:  Head: Normocephalic.  Eyes: Conjunctivae and EOM are normal. Pupils are equal, round, and reactive to light.   Neck: Normal range of motion. Neck supple.  Cardiovascular: Normal rate, regular rhythm and normal heart sounds.   Pulmonary/Chest: Effort normal and breath sounds normal. No respiratory distress. She has no wheezes. She has no rales.  Abdominal: Soft. Bowel sounds are normal. She exhibits no distension. There is no tenderness. There is no rebound.  Musculoskeletal: She exhibits edema.  1+ pitting edema bilaterally. No calf swelling or tenderness. Dorsal pedal pulses are equal and intact bilaterally  Neurological: She is alert and oriented to person, place, and time. No cranial nerve deficit. Coordination normal.  Skin: Skin is warm and dry.  Psychiatric: She has a normal mood and affect. Her behavior is normal.    ED Course  Procedures (including critical care time) Labs Review Labs Reviewed  CBC WITH DIFFERENTIAL - Abnormal; Notable for the following:    WBC 3.2 (*)    Hemoglobin 7.8 (*)    HCT 27.0 (*)    MCV 57.8 (*)    MCH 16.7 (*)    MCHC 28.9 (*)    RDW 19.4 (*)    Neutrophils Relative % 39 (*)    Neutro Abs 1.3 (*)    Monocytes Relative 14 (*)    Basophils Relative 2 (*)    All other components within normal limits  BASIC METABOLIC PANEL - Abnormal; Notable for the following:    Potassium 3.1 (*)    GFR calc non Af Amer 90 (*)    All other components within normal limits  POCT I-STAT, CHEM 8 - Abnormal; Notable for the following:    Potassium 2.6 (*)    Hemoglobin 10.9 (*)    HCT 32.0 (*)    All other components within normal limits  TYPE AND SCREEN  PREPARE RBC (CROSSMATCH)  ABO/RH   Imaging Review No results found.  EKG Interpretation   None       MDM   1. Anemia   2. Hypokalemia   3. Weakness     Patient with weakness, dizziness, near syncope. Recently diagnosed with anemia. Already has outpatient iron transfusion and blood transfusion set up however her symptoms worsened today. She also started Lasix recently for chronic lower extremity  edema. Blood work in emergency department showed hemoglobin of 7.8. Potassium 3.1. i have replaced pt 's potassium with , and IV. Pt is not orthostatic, VS normal, non toxic here. Probably chronic anemia. OFfered a unit transfusion in ED vs follow up for outpatient transfusion. Pt requested a unit here due to severe symptoms.  1:41 AM 1 unit of PRBC transfused. Follow up outpatient. Iron supplements recommended.   Filed Vitals:   06/30/13 2230 06/30/13 2300 07/01/13 0000 07/01/13 0100  BP: 131/69 129/69 105/58 115/63  Pulse: 88 92 87 85  Temp: 98.9 F (37.2 C)  98.6 F (37 C) 98.6 F (37 C) 98.8 F (37.1 C)  TempSrc: Oral Oral Oral Oral  Resp: 7 19 20 19   Weight:      SpO2: 100% 100% 98% 100%      Lottie Mussel, PA-C 07/01/13 0144

## 2013-06-30 NOTE — ED Notes (Addendum)
Pt reports going to [facility] on Friday for left lower leg cellulitis. Was also seen for palpitations and heavy menstrual cycles. Pt was told she is anemic with hgb of 7 and told to call if any symptoms returned prior to outpatient blood transfusion being arranged. Pt reports going to work last night and had multiple episodes of near syncopal episodes and palpitations. Reports blood loss is related to heavy menstrual cycles and thinks near syncope may be related to recently being started on lasix.

## 2013-06-30 NOTE — ED Notes (Signed)
Notified pharmacy that unable to locate tablet of K in any pyxis, [facility] pharmd to send via tube station

## 2013-06-30 NOTE — ED Notes (Signed)
I attempted to arrange for outpatient IV iron. Patient call back on Tuesday noted that she is actually feeling worse with more shortness of breath or palpitations. At this point I believe that she needs a blood transfusion. I discussed the case the patient. She will present to [facility] emergency room ASAP for evaluation and management and possible blood transfusion. She expresses understanding and agreement  Rodolph Bong, MD 06/30/13 [phone]

## 2013-06-30 NOTE — ED Notes (Signed)
Phlebotomy at bedside to draw Type and screen

## 2013-07-01 LAB — TYPE AND SCREEN
ABO/RH(D): AB POS
Unit division: 0

## 2013-07-01 NOTE — ED Provider Notes (Signed)
Medical screening examination/treatment/procedure(s) were conducted as a shared visit with non-physician practitioner(s) or resident  and myself.  I personally evaluated the patient during the encounter and agree with the findings and plan unless otherwise indicated.    I have personally reviewed any xrays and/ or EKG's with the provider and I agree with interpretation.   Chronic anemia, worsening due to fibroids/ vaginal bleeding and Fe Deficiency.   Pt is symptomatic, lightheaded/ sob with exertion with multiple presyncope episodes.  Pt has not received blood before.  Exam mild pale conjunctiva, no distress, no abd tenderness, mild edema LE bilateral, non tender.  CNs intact.  No recent surgery, no cp.  Long discussion regarding r/ b of blood transfusion and that the safest approach would be close outpt fup with Fe infusions with po Fe and then discussion with OB for fibroid treatment/ surgery as she has arranged.  Pt has had worsening sxs for sometime now and requests blood transfusion.  She understands risks of developing antibodies, HIV/ Hepatitis and other transfusion reactions.  She wishes to proceed.  Close fup discussed.  K given in ED, pt on low dose lasix, she will fup outpt with pcp for recheck of labs.   Anemia, Vaginal bleeding, Hypokalemia  Enid Skeens, MD 07/01/13 [phone]

## 2013-10-09 ENCOUNTER — Emergency Department (INDEPENDENT_AMBULATORY_CARE_PROVIDER_SITE_OTHER): Payer: 59

## 2013-10-09 ENCOUNTER — Emergency Department (HOSPITAL_COMMUNITY)
Admission: EM | Admit: 2013-10-09 | Discharge: 2013-10-09 | Disposition: A | Discharge status: Home / Self Care | Payer: 59 | Source: Home / Self Care | Attending: Family Medicine | Admitting: Family Medicine

## 2013-10-09 ENCOUNTER — Encounter (HOSPITAL_COMMUNITY): Payer: Self-pay | Admitting: Emergency Medicine

## 2013-10-09 DIAGNOSIS — S6390XA Sprain of unspecified part of unspecified wrist and hand, initial encounter: Secondary | ICD-10-CM

## 2013-10-09 DIAGNOSIS — S63619A Unspecified sprain of unspecified finger, initial encounter: Secondary | ICD-10-CM

## 2013-10-09 NOTE — ED Notes (Signed)
C/o left hand injury due to jamming hand into house door States ring finger has shooting pain and is tender States she is unable to make fist States movements hurt Did ice and rest hand as tx

## 2013-10-09 NOTE — Discharge Instructions (Signed)
Buddy tape for comfort , warm soak for soreness and swelling, return if further problems.

## 2013-10-09 NOTE — ED Provider Notes (Signed)
CSN: [id number]     Arrival date & time 10/09/13  0822 History   First MD Initiated Contact with Patient 10/09/13 [phone]     Chief Complaint  Patient presents with  . Hand Injury   (Consider location/radiation/quality/duration/timing/severity/associated sxs/prior Treatment) Patient is a 43 y.o. female presenting with hand injury. The history is provided by the patient.  Hand Injury Location:  Hand and finger Time since incident:  12 hours Injury: yes   Mechanism of injury comment:  Jammed going thru house door yest , pain to lrf. Hand location:  L hand Finger location:  L ring finger Pain details:    Quality:  Sharp   Severity:  Mild   Onset quality:  Gradual   Progression:  Unchanged Chronicity:  New Dislocation: no   Prior injury to area:  No Associated symptoms: stiffness   Associated symptoms: no numbness     Past Medical History  Diagnosis Date  . Heart murmur   . Peripheral edema   . Lymphedema   . Fibroids    Past Surgical History  Procedure Laterality Date  . Uterine fibroid surgery    . Myomectomy     History reviewed. No pertinent family history. History  Substance Use Topics  . Smoking status: Never Smoker   . Smokeless tobacco: Not on file  . Alcohol Use: No   OB History   Grav Para Term Preterm Abortions TAB SAB Ect Mult Living                 Review of Systems  Constitutional: Negative.   Musculoskeletal: Positive for joint swelling and stiffness.  Skin: Negative.     Allergies  Review of patient's allergies indicates no known allergies.  Home Medications   Current Outpatient Rx  Name  Route  Sig  Dispense  Refill  . albuterol (PROVENTIL HFA;VENTOLIN HFA) 108 (90 BASE) MCG/ACT inhaler   Inhalation   Inhale 2 puffs into the lungs every 6 (six) hours as needed for shortness of breath.         . doxycycline (VIBRAMYCIN) 100 MG capsule   Oral   Take 100 mg by mouth 2 (two) times daily. Patient started on 06-26-13         . ferrous  sulfate 325 (65 FE) MG tablet   Oral   Take 325 mg by mouth daily with breakfast.         . furosemide (LASIX) 20 MG tablet   Oral   Take 1 tablet (20 mg total) by mouth daily.   30 tablet   1   . ibuprofen (ADVIL,MOTRIN) 800 MG tablet   Oral   Take 800 mg by mouth every 8 (eight) hours as needed for pain.          BP 132/80  Pulse 80  Temp(Src) 98.6 F (37 C) (Oral)  Resp 16  SpO2 99%  LMP 09/27/2013 Physical Exam  Nursing note and vitals reviewed. Constitutional: She is oriented to person, place, and time. She appears well-developed and well-nourished.  Musculoskeletal: She exhibits tenderness.       Hands: Neurological: She is alert and oriented to person, place, and time.  Skin: Skin is warm and dry.    ED Course  Procedures (including critical care time) Labs Review Labs Reviewed - No data to display Imaging Review Dg Hand Complete Left  10/09/2013   ADDENDUM REPORT: 10/09/2013 09:49  ADDENDUM: Areas of concern for nondisplaced fractures are in the proximal fourth and fifth  metacarpals, not in the distal fourth and fifth metacarpals as well as noted in error in the impression portion of the report. The body of the report is correct.   Electronically Signed   By: Lowella Grip M.D.   On: 10/09/2013 09:49   10/09/2013   CLINICAL DATA:  Pain post trauma  EXAM: LEFT HAND - COMPLETE 3+ VIEW  COMPARISON:  None.  FINDINGS: Frontal, oblique, and lateral views were obtained. There are nondisplaced fractures of the proximal fourth and fifth metacarpals. There is also a nondisplaced fracture of the hamate bone. No other fractures. No dislocation. Joint spaces appear intact.  IMPRESSION: Nondisplaced fractures of the distal fourth and fifth metacarpals as well as the lateral hamate bone.  Electronically Signed: By: Lowella Grip M.D. On: 10/09/2013 09:21      MDM  X-rays reviewed and report per radiologist.     Billy Fischer, MD 10/09/13 [phone]

## 2015-04-01 ENCOUNTER — Emergency Department (HOSPITAL_COMMUNITY): Payer: 59

## 2015-04-01 ENCOUNTER — Encounter (HOSPITAL_COMMUNITY): Payer: Self-pay | Admitting: Emergency Medicine

## 2015-04-01 ENCOUNTER — Emergency Department (HOSPITAL_COMMUNITY)
Admission: EM | Admit: 2015-04-01 | Discharge: 2015-04-01 | Disposition: A | Discharge status: Home / Self Care | Payer: 59 | Attending: Emergency Medicine | Admitting: Emergency Medicine

## 2015-04-01 DIAGNOSIS — Z3202 Encounter for pregnancy test, result negative: Secondary | ICD-10-CM | POA: Insufficient documentation

## 2015-04-01 DIAGNOSIS — H6591 Unspecified nonsuppurative otitis media, right ear: Secondary | ICD-10-CM | POA: Diagnosis not present

## 2015-04-01 DIAGNOSIS — R011 Cardiac murmur, unspecified: Secondary | ICD-10-CM | POA: Insufficient documentation

## 2015-04-01 DIAGNOSIS — H748X2 Other specified disorders of left middle ear and mastoid: Secondary | ICD-10-CM | POA: Diagnosis not present

## 2015-04-01 DIAGNOSIS — J029 Acute pharyngitis, unspecified: Secondary | ICD-10-CM | POA: Insufficient documentation

## 2015-04-01 DIAGNOSIS — I10 Essential (primary) hypertension: Secondary | ICD-10-CM | POA: Insufficient documentation

## 2015-04-01 DIAGNOSIS — N938 Other specified abnormal uterine and vaginal bleeding: Secondary | ICD-10-CM | POA: Insufficient documentation

## 2015-04-01 DIAGNOSIS — Z86018 Personal history of other benign neoplasm: Secondary | ICD-10-CM | POA: Insufficient documentation

## 2015-04-01 DIAGNOSIS — Z79899 Other long term (current) drug therapy: Secondary | ICD-10-CM | POA: Insufficient documentation

## 2015-04-01 DIAGNOSIS — R42 Dizziness and giddiness: Secondary | ICD-10-CM | POA: Diagnosis present

## 2015-04-01 LAB — COMPREHENSIVE METABOLIC PANEL
ALBUMIN: 3.3 g/dL — AB (ref 3.5–5.0)
ALT: 29 U/L (ref 14–54)
ANION GAP: 8 (ref 5–15)
AST: 28 U/L (ref 15–41)
Alkaline Phosphatase: 69 U/L (ref 38–126)
BILIRUBIN TOTAL: 0.4 mg/dL (ref 0.3–1.2)
BUN: 6 mg/dL (ref 6–20)
CALCIUM: 8.8 mg/dL — AB (ref 8.9–10.3)
CHLORIDE: 103 mmol/L (ref 101–111)
CO2: 28 mmol/L (ref 22–32)
Creatinine, Ser: 0.68 mg/dL (ref 0.44–1.00)
GFR calc Af Amer: >60 mL/min (ref >60)
GFR calc non Af Amer: >60 mL/min (ref >60)
Glucose, Bld: 107 mg/dL — ABNORMAL HIGH (ref 65–99)
Potassium: 3.5 mmol/L (ref 3.5–5.1)
Sodium: 139 mmol/L (ref 135–145)
TOTAL PROTEIN: 6.9 g/dL (ref 6.5–8.1)

## 2015-04-01 LAB — CBC WITH DIFFERENTIAL/PLATELET
BASOS ABS: 0 10*3/uL (ref 0.0–0.1)
Basophils Relative: 0 % (ref 0–1)
EOS PCT: 3 % (ref 0–5)
Eosinophils Absolute: 0.2 10*3/uL (ref 0.0–0.7)
HCT: 28.4 % — ABNORMAL LOW (ref 36.0–46.0)
Hemoglobin: 8.3 g/dL — ABNORMAL LOW (ref 12.0–15.0)
Lymphocytes Relative: 14 % (ref 12–46)
Lymphs Abs: 1.1 10*3/uL (ref 0.7–4.0)
MCH: 18.8 pg — ABNORMAL LOW (ref 26.0–34.0)
MCHC: 29.2 g/dL — ABNORMAL LOW (ref 30.0–36.0)
MCV: 64.4 fL — ABNORMAL LOW (ref 78.0–100.0)
MONO ABS: 0.9 10*3/uL (ref 0.1–1.0)
MONOS PCT: 11 % (ref 3–12)
NEUTROS PCT: 72 % (ref 43–77)
Neutro Abs: 5.8 10*3/uL (ref 1.7–7.7)
Platelets: 289 10*3/uL (ref 150–400)
RBC: 4.41 MIL/uL (ref 3.87–5.11)
RDW: 18.6 % — AB (ref 11.5–15.5)
WBC: 8 10*3/uL (ref 4.0–10.5)

## 2015-04-01 LAB — URINE MICROSCOPIC-ADD ON

## 2015-04-01 LAB — URINALYSIS, ROUTINE W REFLEX MICROSCOPIC
Bilirubin Urine: NEGATIVE
Glucose, UA: NEGATIVE mg/dL
Ketones, ur: NEGATIVE mg/dL
Leukocytes, UA: NEGATIVE
Nitrite: NEGATIVE
Protein, ur: NEGATIVE mg/dL
SPECIFIC GRAVITY, URINE: 1.022 (ref 1.005–1.030)
Urobilinogen, UA: 4 mg/dL — ABNORMAL HIGH (ref 0.0–1.0)
pH: 7 (ref 5.0–8.0)

## 2015-04-01 LAB — POC URINE PREG, ED: PREG TEST UR: NEGATIVE

## 2015-04-01 LAB — RAPID STREP SCREEN (MED CTR MEBANE ONLY): Streptococcus, Group A Screen (Direct): NEGATIVE

## 2015-04-01 MED ORDER — HYDROCODONE-HOMATROPINE 5-1.5 MG/5ML PO SYRP: 5.0000 mL | ORAL_SOLUTION | Freq: Four times a day (QID) | ORAL | Status: AC | PRN

## 2015-04-01 MED ORDER — FERROUS SULFATE 325 (65 FE) MG PO TABS: 325.0000 mg | ORAL_TABLET | Freq: Every day | ORAL | Status: AC

## 2015-04-01 MED ORDER — AMOXICILLIN 500 MG PO CAPS
500.0000 mg | ORAL_CAPSULE | Freq: Once | ORAL | Status: AC
  Administered 2015-04-01: 500 mg via ORAL
  Filled 2015-04-01: qty 1

## 2015-04-01 MED ORDER — HYDROCODONE-HOMATROPINE 5-1.5 MG/5ML PO SYRP
5.0000 mL | ORAL_SOLUTION | Freq: Once | ORAL | Status: AC
  Administered 2015-04-01: 5 mL via ORAL
  Filled 2015-04-01: qty 5

## 2015-04-01 MED ORDER — DEXAMETHASONE 1 MG/ML PO CONC
10.0000 mg | Freq: Once | ORAL | Status: AC
  Administered 2015-04-01: 10 mg via ORAL
  Filled 2015-04-01: qty 10

## 2015-04-01 MED ORDER — AMOXICILLIN 500 MG PO CAPS: 500.0000 mg | ORAL_CAPSULE | Freq: Two times a day (BID) | ORAL | Status: AC

## 2015-04-01 MED ORDER — SODIUM CHLORIDE 0.9 % IV BOLUS (SEPSIS)
1000.0000 mL | Freq: Once | INTRAVENOUS | Status: AC
  Administered 2015-04-01: 1000 mL via INTRAVENOUS

## 2015-04-01 NOTE — ED Provider Notes (Signed)
CSN: [id number]     Arrival date & time 04/01/15  2004 History   First MD Initiated Contact with Patient 04/01/15 2117     Chief Complaint  Patient presents with  . Dizziness  . Sore Throat  . Fever  . Hypertension     (Consider location/radiation/quality/duration/timing/severity/associated sxs/prior Treatment) HPI Comments: Patient is a 44 year old female past medical history significant for peripheral edema, lymphedema, fibroids, dysfunctional uterine bleeding, anemia presenting to the emergency department for 2 complaints. Patient states since Saturday after attending a concert she felt as though her voice is gone. She states her throat is sore as well and has had a nonproductive cough with some subjective fever and chills. Patient states that around noon today she felt lightheaded and had dizzy spells. Patient states she was walking around at work she felt very flushed in her vision went in and out. She states she is currently on her menstrual cycle which is normally very heavy, unchanged today. She was supposed to have a hysterectomy to help with dysfunctional uterine bleeding causing anemia, and sold. She is no longer taking her iron supplements. Patient states she is feeling better currently. Denies any hematemesis, hemoptysis, melanotic or bright red blood per rectum.   Past Medical History  Diagnosis Date  . Heart murmur   . Peripheral edema   . Lymphedema   . Fibroids    Past Surgical History  Procedure Laterality Date  . Uterine fibroid surgery    . Myomectomy     No family history on file. History  Substance Use Topics  . Smoking status: Never Smoker   . Smokeless tobacco: Not on file  . Alcohol Use: No   OB History    No data available     Review of Systems  Constitutional: Positive for fever (subjective) and chills.  HENT: Positive for ear pain and sore throat. Negative for ear discharge.   Respiratory: Positive for cough. Negative for shortness of breath.    Cardiovascular: Negative for chest pain.  Gastrointestinal: Negative for nausea, vomiting and abdominal pain.  Genitourinary: Positive for vaginal bleeding.  Neurological: Positive for dizziness and light-headedness. Negative for numbness and headaches.  All other systems reviewed and are negative.     Allergies  Review of patient's allergies indicates no known allergies.  Home Medications   Prior to Admission medications   Medication Sig Start Date End Date Taking? Authorizing Provider  albuterol (PROVENTIL HFA;VENTOLIN HFA) 108 (90 BASE) MCG/ACT inhaler Inhale 2 puffs into the lungs every 6 (six) hours as needed for shortness of breath.   Yes Historical Provider, MD  BEE POLLEN PO Take 1 tablet by mouth daily.   Yes Historical Provider, MD  ibuprofen (ADVIL,MOTRIN) 800 MG tablet Take 800 mg by mouth every 8 (eight) hours as needed for pain.   Yes Historical Provider, MD  Multiple Vitamins-Minerals (MULTIVITAMIN GUMMIES WOMENS PO) Take 1 tablet by mouth daily.   Yes Historical Provider, MD  phentermine 37.5 MG capsule Take 37.5 mg by mouth every morning.   Yes Historical Provider, MD  amoxicillin (AMOXIL) 500 MG capsule Take 1 capsule (500 mg total) by mouth 2 (two) times daily. 04/01/15   Render Marley, PA-C  ferrous sulfate 325 (65 FE) MG tablet Take 1 tablet (325 mg total) by mouth daily. 04/01/15   Blade Scheff, PA-C  furosemide (LASIX) 20 MG tablet Take 1 tablet (20 mg total) by mouth daily. Patient not taking: Reported on 04/01/2015 06/26/13   Gregor Hams, MD  HYDROcodone-homatropine (HYCODAN) 5-1.5 MG/5ML syrup Take 5 mLs by mouth every 6 (six) hours as needed. 04/01/15   Kolbi Altadonna, PA-C   BP 151/92 mmHg  Pulse 90  Temp(Src) 98.5 F (36.9 C) (Oral)  Resp 20  Wt 257 lb (116.574 kg)  SpO2 100%  LMP 03/30/2015 Physical Exam  Constitutional: She is oriented to person, place, and time. She appears well-developed and well-nourished. No distress.  HENT:   Head: Normocephalic and atraumatic.  Right Ear: Hearing, external ear and ear canal normal. No mastoid tenderness. Tympanic membrane is erythematous. A middle ear effusion is present.  Left Ear: Hearing, external ear and ear canal normal. No mastoid tenderness. A middle ear effusion is present.  Nose: Nose normal.  Mouth/Throat: Uvula is midline and mucous membranes are normal. Posterior oropharyngeal erythema present. No oropharyngeal exudate, posterior oropharyngeal edema or tonsillar abscesses.  Eyes: Conjunctivae and EOM are normal. Pupils are equal, round, and reactive to light.  Neck: Normal range of motion. Neck supple.  Cardiovascular: Normal rate, regular rhythm, normal heart sounds and intact distal pulses.   Pulmonary/Chest: Effort normal and breath sounds normal. No respiratory distress.  Abdominal: Soft. There is no tenderness.  Neurological: She is alert and oriented to person, place, and time. She has normal strength. No cranial nerve deficit. Gait normal. GCS eye subscore is 4. GCS verbal subscore is 5. GCS motor subscore is 6.  Sensation grossly intact.  No pronator drift.  Bilateral heel-knee-shin intact.  Skin: Skin is warm and dry. She is not diaphoretic.  Nursing note and vitals reviewed.   ED Course  Procedures (including critical care time) Medications  sodium chloride 0.9 % bolus 1,000 mL (0 mLs Intravenous Stopped 04/01/15 2330)  dexamethasone (DECADRON) 1 MG/ML solution 10 mg (10 mg Oral Given 04/01/15 2331)  HYDROcodone-homatropine (HYCODAN) 5-1.5 MG/5ML syrup 5 mL (5 mLs Oral Given 04/01/15 2323)  amoxicillin (AMOXIL) capsule 500 mg (500 mg Oral Given 04/01/15 2323)    Labs Review Labs Reviewed  CBC WITH DIFFERENTIAL/PLATELET - Abnormal; Notable for the following:    Hemoglobin 8.3 (*)    HCT 28.4 (*)    MCV 64.4 (*)    MCH 18.8 (*)    MCHC 29.2 (*)    RDW 18.6 (*)    All other components within normal limits  COMPREHENSIVE METABOLIC PANEL - Abnormal;  Notable for the following:    Glucose, Bld 107 (*)    Calcium 8.8 (*)    Albumin 3.3 (*)    All other components within normal limits  URINALYSIS, ROUTINE W REFLEX MICROSCOPIC (NOT AT [facility]) - Abnormal; Notable for the following:    Hgb urine dipstick SMALL (*)    Urobilinogen, UA 4.0 (*)    All other components within normal limits  RAPID STREP SCREEN (NOT AT [facility])  CULTURE, GROUP A STREP  URINE MICROSCOPIC-ADD ON  POC URINE PREG, ED    Imaging Review Dg Chest 2 View  04/01/2015   CLINICAL DATA:  Cough, shortness of breath.  EXAM: CHEST  2 VIEW  COMPARISON:  None.  FINDINGS: The heart size and mediastinal contours are within normal limits. Both lungs are clear. The visualized skeletal structures are unremarkable.  IMPRESSION: No active cardiopulmonary disease.   Electronically Signed   By: Rolm Baptise M.D.   On: 04/01/2015 22:01     EKG Interpretation   Date/Time:  Friday April 01 2015 22:17:41 EDT Ventricular Rate:  91 PR Interval:  129 QRS Duration: 84 QT Interval:  346  QTC Calculation: 426 R Axis:   54 Text Interpretation:  Sinus rhythm ED PHYSICIAN INTERPRETATION AVAILABLE  IN [facility] Confirmed by TEST, Record ([id number]) on 04/02/2015 2:10:55  PM      MDM   Final diagnoses:  Dysfunctional uterine bleeding  Right otitis media with effusion    Filed Vitals:   04/01/15 2319  BP: 151/92  Pulse: 90  Temp:   Resp:    Afebrile, NAD, non-toxic appearing, AAOx4.   I have reviewed nursing notes, vital signs, and all lab and imaging results as noted above.  Patient presenting to the emergency department with multiple complaints. Examination no neurofocal deficits appreciated. Oropharynx is clear without trismus or uvula deviation. No cervical adenopathy. No mastoid tenderness or swelling. Right TM is erythematous without light reflex. Lungs clear to auscultation bilaterally. Cardiac exam is normal. Abdomen is soft, nontender, nondistended. Patient history of  dysfunctional uterine bleeding, currently on her menstrual cycle and not taking her iron supplements, likely source of lightheadedness. Will treat with amoxicillin for otitis media. Will start patient back on iron supplements in for follow-up with her OB/GYN for further management and evaluation. Will discharge patient home. Return precautions discussed. Patient is to plan and stable at time of discharge. Patient d/w with Dr. Ralene Bathe, agrees with plan.        Baron Sane, PA-C 04/03/15 [id number]  Quintella Reichert, MD 04/04/15 [phone]

## 2015-04-01 NOTE — ED Notes (Signed)
Pt A&Ox4, ambulatory at d/c with steady gait and states she has all of her belongings with her and is not missing anything.

## 2015-04-01 NOTE — Discharge Instructions (Signed)
Please follow up with your primary care physician in 1-2 days. If you do not have one please call the [facility] number listed above. Please follow up with your Ob/Gyn to schedule a follow up appointment.  Please take your antibiotic until completion. Please read all discharge instructions and return precautions.    Abnormal Uterine Bleeding Abnormal uterine bleeding can affect women at various stages in life, including teenagers, women in their reproductive years, pregnant women, and women who have reached menopause. Several kinds of uterine bleeding are considered abnormal, including:  Bleeding or spotting between periods.   Bleeding after sexual intercourse.   Bleeding that is heavier or more than normal.   Periods that last longer than usual.  Bleeding after menopause.  Many cases of abnormal uterine bleeding are minor and simple to treat, while others are more serious. Any type of abnormal bleeding should be evaluated by your health care provider. Treatment will depend on the cause of the bleeding. HOME CARE INSTRUCTIONS Monitor your condition for any changes. The following actions may help to alleviate any discomfort you are experiencing:  Avoid the use of tampons and douches as directed by your health care provider.  Change your pads frequently. You should get regular pelvic exams and Pap tests. Keep all follow-up appointments for diagnostic tests as directed by your health care provider.  SEEK MEDICAL CARE IF:   Your bleeding lasts more than 1 week.   You feel dizzy at times.  SEEK IMMEDIATE MEDICAL CARE IF:   You pass out.   You are changing pads every 15 to 30 minutes.   You have abdominal pain.  You have a fever.   You become sweaty or weak.   You are passing large blood clots from the vagina.   You start to feel nauseous and vomit. MAKE SURE YOU:   Understand these instructions.  Will watch your condition.  Will get help  right away if you are not doing well or get worse. Document Released: 08/20/2005 Document Revised: 08/25/2013 Document Reviewed: 03/19/2013 [facility] Patient Information 2015 [place], [place]. This information is not intended to replace advice given to you by your health care provider. Make sure you discuss any questions you have with your health care provider. Otitis Media With Effusion Otitis media with effusion is the presence of fluid in the middle ear. This is a common problem in children, which often follows ear infections. It may be present for weeks or longer after the infection. Unlike an acute ear infection, otitis media with effusion refers only to fluid behind the ear drum and not infection. Children with repeated ear and sinus infections and allergy problems are the most likely to get otitis media with effusion. CAUSES  The most frequent cause of the fluid buildup is dysfunction of the eustachian tubes. These are the tubes that drain fluid in the ears to the back of the nose (nasopharynx). SYMPTOMS   The main symptom of this condition is hearing loss. As a result, you or your child may:  Listen to the TV at a loud volume.  Not respond to questions.  Ask "what" often when spoken to.  Mistake or confuse one sound or word for another.  There may be a sensation of fullness or pressure but usually not pain. DIAGNOSIS   Your health care provider will diagnose this condition by examining you or your child's ears.  Your health care provider may test the pressure in you or your child's ear with  a tympanometer.  A hearing test may be conducted if the problem persists. TREATMENT   Treatment depends on the duration and the effects of the effusion.  Antibiotics, decongestants, nose drops, and cortisone-type drugs (tablets or nasal spray) may not be helpful.  Children with persistent ear effusions may have delayed language or behavioral problems. Children at risk for developmental  delays in hearing, learning, and speech may require referral to a specialist earlier than children not at risk.  You or your child's health care provider may suggest a referral to an ear, nose, and throat surgeon for treatment. The following may help restore normal hearing:  Drainage of fluid.  Placement of ear tubes (tympanostomy tubes).  Removal of adenoids (adenoidectomy). HOME CARE INSTRUCTIONS   Avoid secondhand smoke.  Infants who are breastfed are less likely to have this condition.  Avoid feeding infants while they are lying flat.  Avoid known environmental allergens.  Avoid people who are sick. SEEK MEDICAL CARE IF:   Hearing is not better in 3 months.  Hearing is worse.  Ear pain.  Drainage from the ear.  Dizziness. MAKE SURE YOU:   Understand these instructions.  Will watch your condition.  Will get help right away if you are not doing well or get worse. Document Released: 09/27/2004 Document Revised: 01/04/2014 Document Reviewed: 03/17/2013 [facility] Patient Information 2015 [place], [place]. This information is not intended to replace advice given to you by your health care provider. Make sure you discuss any questions you have with your health care provider.

## 2015-04-01 NOTE — ED Notes (Signed)
Pt reports she took one 800 mg ibuprofen while in the waiting room.

## 2015-04-01 NOTE — ED Notes (Signed)
Pt. presents with multiple complaints : " dizzy spells"/lightheaded onset noon today ; fever yesterday ; sore throat with dry/productive cough ; and elevated blood pressure 180/90 . Pt. Stated she had a concert last Saturday and lost her voice.

## 2015-04-04 LAB — CULTURE, GROUP A STREP

## 2016-08-23 DIAGNOSIS — R1032 Left lower quadrant pain: Secondary | ICD-10-CM | POA: Diagnosis not present

## 2016-08-23 DIAGNOSIS — N898 Other specified noninflammatory disorders of vagina: Secondary | ICD-10-CM | POA: Diagnosis not present

## 2016-08-23 DIAGNOSIS — M7989 Other specified soft tissue disorders: Secondary | ICD-10-CM | POA: Diagnosis not present

## 2016-08-23 DIAGNOSIS — D259 Leiomyoma of uterus, unspecified: Secondary | ICD-10-CM | POA: Diagnosis not present

## 2016-08-23 DIAGNOSIS — N939 Abnormal uterine and vaginal bleeding, unspecified: Secondary | ICD-10-CM | POA: Diagnosis not present

## 2016-08-23 DIAGNOSIS — N83209 Unspecified ovarian cyst, unspecified side: Secondary | ICD-10-CM | POA: Diagnosis not present

## 2017-03-08 ENCOUNTER — Encounter (HOSPITAL_COMMUNITY): Payer: Self-pay | Admitting: Emergency Medicine

## 2017-03-08 ENCOUNTER — Emergency Department (HOSPITAL_COMMUNITY)
Admission: EM | Admit: 2017-03-08 | Discharge: 2017-03-08 | Disposition: A | Discharge status: Home / Self Care | Payer: 59 | Attending: Emergency Medicine | Admitting: Emergency Medicine

## 2017-03-08 DIAGNOSIS — J019 Acute sinusitis, unspecified: Secondary | ICD-10-CM | POA: Diagnosis not present

## 2017-03-08 DIAGNOSIS — R519 Headache, unspecified: Secondary | ICD-10-CM

## 2017-03-08 DIAGNOSIS — R51 Headache: Secondary | ICD-10-CM

## 2017-03-08 LAB — BASIC METABOLIC PANEL
Anion gap: 7 (ref 5–15)
BUN: 12 mg/dL (ref 6–20)
CHLORIDE: 103 mmol/L (ref 101–111)
CO2: 25 mmol/L (ref 22–32)
CREATININE: 0.64 mg/dL (ref 0.44–1.00)
Calcium: 8.9 mg/dL (ref 8.9–10.3)
GFR calc non Af Amer: >60 mL/min (ref >60)
GLUCOSE: 125 mg/dL — AB (ref 65–99)
Potassium: 3.5 mmol/L (ref 3.5–5.1)
Sodium: 135 mmol/L (ref 135–145)

## 2017-03-08 LAB — CBC WITH DIFFERENTIAL/PLATELET
Basophils Absolute: 0 10*3/uL (ref 0.0–0.1)
Basophils Relative: 1 %
EOS PCT: 4 %
Eosinophils Absolute: 0.1 10*3/uL (ref 0.0–0.7)
HCT: 30.9 % — ABNORMAL LOW (ref 36.0–46.0)
Hemoglobin: 8.9 g/dL — ABNORMAL LOW (ref 12.0–15.0)
Lymphocytes Relative: 24 %
Lymphs Abs: 0.8 10*3/uL (ref 0.7–4.0)
MCH: 18.3 pg — AB (ref 26.0–34.0)
MCHC: 28.8 g/dL — ABNORMAL LOW (ref 30.0–36.0)
MCV: 63.6 fL — AB (ref 78.0–100.0)
Monocytes Absolute: 0.4 10*3/uL (ref 0.1–1.0)
Monocytes Relative: 11 %
Neutro Abs: 2 10*3/uL (ref 1.7–7.7)
Neutrophils Relative %: 60 %
PLATELETS: 266 10*3/uL (ref 150–400)
RBC: 4.86 MIL/uL (ref 3.87–5.11)
RDW: 19.3 % — ABNORMAL HIGH (ref 11.5–15.5)
WBC: 3.3 10*3/uL — ABNORMAL LOW (ref 4.0–10.5)

## 2017-03-08 MED ORDER — TRIAMCINOLONE ACETONIDE 55 MCG/ACT NA AERO
2.0000 | INHALATION_SPRAY | Freq: Every day | NASAL | Status: DC
  Administered 2017-03-08: 2 via NASAL
  Filled 2017-03-08: qty 21.6

## 2017-03-08 MED ORDER — AMOXICILLIN-POT CLAVULANATE 875-125 MG PO TABS: 1.0000 | ORAL_TABLET | Freq: Two times a day (BID) | ORAL | 0 refills | Status: AC

## 2017-03-08 MED ORDER — IBUPROFEN 400 MG PO TABS: 400.0000 mg | ORAL_TABLET | Freq: Three times a day (TID) | ORAL | 0 refills | Status: AC

## 2017-03-08 MED ORDER — DEXAMETHASONE SODIUM PHOSPHATE 10 MG/ML IJ SOLN
10.0000 mg | Freq: Once | INTRAMUSCULAR | Status: AC
  Administered 2017-03-08: 10 mg via INTRAMUSCULAR
  Filled 2017-03-08: qty 1

## 2017-03-08 MED ORDER — KETOROLAC TROMETHAMINE 30 MG/ML IJ SOLN
15.0000 mg | Freq: Once | INTRAMUSCULAR | Status: AC
  Administered 2017-03-08: 15 mg via INTRAMUSCULAR
  Filled 2017-03-08: qty 1

## 2017-03-08 MED ORDER — AMOXICILLIN-POT CLAVULANATE 875-125 MG PO TABS
1.0000 | ORAL_TABLET | Freq: Once | ORAL | Status: AC
  Administered 2017-03-08: 1 via ORAL
  Filled 2017-03-08: qty 1

## 2017-03-08 NOTE — Discharge Instructions (Signed)
As discussed, your evaluation today has been largely reassuring.  But, it is important that you monitor your condition carefully, and do not hesitate to return to the ED if you develop new, or concerning changes in your condition.  Please use the provided inhaler daily, with 2 sprays in each nostril for the next week.  In addition, please take all prescribed medication as directed.  Please be sure to follow-up with your physician and your dentist.

## 2017-03-08 NOTE — ED Triage Notes (Signed)
Patient complaining of right ear pain that radiates to her right eye and right side of face. Patient reports intermittent blurred vision in right eye. Patient alert and oriented times 4.

## 2017-03-08 NOTE — ED Provider Notes (Signed)
[facility] DEPT Provider Note   CSN: [id number] Arrival date & time: 03/08/17  0831     History   Chief Complaint Chief Complaint  Patient presents with  . Otalgia  . Neck Pain    HPI Debra Hutchinson is a 46 y.o. female.  HPI  Patient presents with concern of facial pain and swelling. Symptoms began about 2 weeks ago, without clear precipitant. Patient has initial discomfort about the right maxillary prominence per Over the interval 2 weeks the patient has developed mild swelling in this area, generalized discomfort from the maxilla, to the right lateral face, right forehead, without superficial changes. There is some diminished hearing, with a full sensation in the right ear. No fever, no chills. Patient notes no improvement in spite of using Sudafed, leftover antibiotics, OTC analgesia. She states that she is generally well and was well prior to this development. No difficulty swallowing, speaking, breathing, no abdominal pain nausea, vomiting, diarrhea.  Past Medical History:  Diagnosis Date  . Fibroids   . Heart murmur   . Lymphedema   . Peripheral edema     There are no active problems to display for this patient.   Past Surgical History:  Procedure Laterality Date  . MYOMECTOMY    . UTERINE FIBROID SURGERY      OB History    No data available       Home Medications    Prior to Admission medications   Medication Sig Start Date End Date Taking? Authorizing Provider  albuterol (PROVENTIL HFA;VENTOLIN HFA) 108 (90 BASE) MCG/ACT inhaler Inhale 2 puffs into the lungs every 6 (six) hours as needed for shortness of breath.    [provider]  amoxicillin (AMOXIL) 500 MG capsule Take 1 capsule (500 mg total) by mouth 2 (two) times daily. 04/01/15   Piepenbrink, Anderson [place], PA-C  BEE POLLEN PO Take 1 tablet by mouth daily.    [provider]  ferrous sulfate 325 (65 FE) MG tablet Take 1 tablet (325 mg total) by mouth daily. 04/01/15    Piepenbrink, Anderson [place], PA-C  furosemide (LASIX) 20 MG tablet Take 1 tablet (20 mg total) by mouth daily. Patient not taking: Reported on 04/01/2015 06/26/13   Gregor Hams, MD  HYDROcodone-homatropine [facility]) 5-1.5 MG/5ML syrup Take 5 mLs by mouth every 6 (six) hours as needed. 04/01/15   Piepenbrink, Anderson [place], PA-C  ibuprofen (ADVIL,MOTRIN) 800 MG tablet Take 800 mg by mouth every 8 (eight) hours as needed for pain.    [provider]  Multiple Vitamins-Minerals (MULTIVITAMIN GUMMIES WOMENS PO) Take 1 tablet by mouth daily.    [provider]  phentermine 37.5 MG capsule Take 37.5 mg by mouth every morning.    [provider]    Family History No family history on file.  Social History Social History  Substance Use Topics  . Smoking status: Never Smoker  . Smokeless tobacco: Never Used  . Alcohol use No     Allergies   Patient has no known allergies.   Review of Systems Review of Systems  Constitutional:       Per HPI, otherwise negative  HENT:       Per HPI, otherwise negative  Respiratory:       Per HPI, otherwise negative  Cardiovascular:       Per HPI, otherwise negative  Gastrointestinal: Negative for vomiting.  Endocrine:       Negative aside from HPI  Genitourinary:       Neg aside  from HPI   Musculoskeletal:       Per HPI, otherwise negative  Skin: Negative.   Neurological: Negative for syncope.     Physical Exam Updated Vital Signs BP (!) 174/91 (BP Location: Right Arm)   Pulse 86   Temp 98.3 F (36.8 C) (Oral)   Resp 16   Wt 113.4 kg (250 lb)   LMP 03/08/2017   SpO2 100%   Physical Exam  Constitutional: She is oriented to person, place, and time. She appears well-developed and well-nourished. No distress.  HENT:  Head: Normocephalic and atraumatic.    Right Ear: A middle ear effusion is present.  Left Ear: A middle ear effusion is present.  Mouth/Throat: Oropharynx is clear and moist and mucous membranes are  normal. No trismus in the jaw. No uvula swelling.  Eyes: Conjunctivae and EOM are normal.  Neck:    Cardiovascular: Normal rate and regular rhythm.   Pulmonary/Chest: Effort normal and breath sounds normal. No stridor. No respiratory distress.  Abdominal: She exhibits no distension.  Musculoskeletal: She exhibits no edema.  Neurological: She is alert and oriented to person, place, and time. No cranial nerve deficit.  Skin: Skin is warm and dry.  Psychiatric: She has a normal mood and affect.  Nursing note and vitals reviewed.    ED Treatments / Results  Labs (all labs ordered are listed, but only abnormal results are displayed) Labs Reviewed  BASIC METABOLIC PANEL  CBC WITH DIFFERENTIAL/PLATELET     Procedures Procedures (including critical care time)  Medications Ordered in ED Medications  ketorolac (TORADOL) 30 MG/ML injection 15 mg (not administered)  dexamethasone (DECADRON) injection 10 mg (not administered)  amoxicillin-clavulanate (AUGMENTIN) 875-125 MG per tablet 1 tablet (not administered)  triamcinolone (NASACORT) nasal inhaler 2 spray (not administered)     Initial Impression / Assessment and Plan / ED Course  I have reviewed the triage vital signs and the nursing notes.  Pertinent labs & imaging results that were available during my care of the patient were reviewed by me and considered in my medical decision making (see chart for details).  Well-appearing young female presents with right cervical adenopathy, right facial swelling, tenderness about her sinuses, frontal, maxillary. No obviously broken teeth, no obvious source for infection, but some suspicion for acute sinusitis, given the persistency for over 2 weeks, the patient was started on a course of antibiotics, received anti-inflammatories, steroids, was discharged in stable condition.  10:46 AM Patient states that she feels much better  Final Clinical Impressions(s) / ED Diagnoses  Acute  sinusitis   Carmin Muskrat, MD 03/08/17 1047

## 2017-03-08 NOTE — ED Triage Notes (Signed)
Per Pt, Pt is coming from home with complaints of right ear pain and neck pain that started two weeks ago. Pt reports it has been progressively worse with some vision change on the right side. Pt reports taking OTC medication with no change.

## 2017-03-13 ENCOUNTER — Emergency Department (HOSPITAL_COMMUNITY)
Admission: EM | Admit: 2017-03-13 | Discharge: 2017-03-13 | Disposition: A | Discharge status: Home / Self Care | Payer: 59 | Attending: Emergency Medicine | Admitting: Emergency Medicine

## 2017-03-13 ENCOUNTER — Emergency Department (HOSPITAL_COMMUNITY): Payer: 59

## 2017-03-13 ENCOUNTER — Encounter (HOSPITAL_COMMUNITY): Payer: Self-pay | Admitting: Emergency Medicine

## 2017-03-13 DIAGNOSIS — K029 Dental caries, unspecified: Secondary | ICD-10-CM | POA: Insufficient documentation

## 2017-03-13 DIAGNOSIS — Z79899 Other long term (current) drug therapy: Secondary | ICD-10-CM | POA: Diagnosis not present

## 2017-03-13 DIAGNOSIS — H9201 Otalgia, right ear: Secondary | ICD-10-CM | POA: Diagnosis present

## 2017-03-13 DIAGNOSIS — R51 Headache: Secondary | ICD-10-CM | POA: Diagnosis not present

## 2017-03-13 DIAGNOSIS — J3489 Other specified disorders of nose and nasal sinuses: Secondary | ICD-10-CM | POA: Diagnosis not present

## 2017-03-13 DIAGNOSIS — R519 Headache, unspecified: Secondary | ICD-10-CM

## 2017-03-13 LAB — CBC WITH DIFFERENTIAL/PLATELET
BASOS ABS: 0 10*3/uL (ref 0.0–0.1)
Basophils Relative: 0 %
EOS ABS: 0.2 10*3/uL (ref 0.0–0.7)
Eosinophils Relative: 5 %
HCT: 29.8 % — ABNORMAL LOW (ref 36.0–46.0)
HEMOGLOBIN: 8.8 g/dL — AB (ref 12.0–15.0)
Lymphocytes Relative: 41 %
Lymphs Abs: 1.9 10*3/uL (ref 0.7–4.0)
MCH: 18.8 pg — ABNORMAL LOW (ref 26.0–34.0)
MCHC: 29.5 g/dL — ABNORMAL LOW (ref 30.0–36.0)
MCV: 63.7 fL — ABNORMAL LOW (ref 78.0–100.0)
Monocytes Absolute: 0.4 10*3/uL (ref 0.1–1.0)
Monocytes Relative: 9 %
Neutro Abs: 2.1 10*3/uL (ref 1.7–7.7)
Neutrophils Relative %: 45 %
Platelets: 286 10*3/uL (ref 150–400)
RBC: 4.68 MIL/uL (ref 3.87–5.11)
RDW: 19.8 % — ABNORMAL HIGH (ref 11.5–15.5)
WBC: 4.6 10*3/uL (ref 4.0–10.5)

## 2017-03-13 LAB — I-STAT BETA HCG BLOOD, ED (MC, WL, AP ONLY): I-stat hCG, quantitative: <5 m[IU]/mL (ref <5)

## 2017-03-13 LAB — COMPREHENSIVE METABOLIC PANEL
ALK PHOS: 40 U/L (ref 38–126)
ALT: 17 U/L (ref 14–54)
AST: 16 U/L (ref 15–41)
Albumin: 3.6 g/dL (ref 3.5–5.0)
Anion gap: 7 (ref 5–15)
BUN: 13 mg/dL (ref 6–20)
CALCIUM: 8.6 mg/dL — AB (ref 8.9–10.3)
CO2: 26 mmol/L (ref 22–32)
Chloride: 102 mmol/L (ref 101–111)
Creatinine, Ser: 0.59 mg/dL (ref 0.44–1.00)
GFR calc non Af Amer: >60 mL/min (ref >60)
Glucose, Bld: 104 mg/dL — ABNORMAL HIGH (ref 65–99)
Potassium: 3.3 mmol/L — ABNORMAL LOW (ref 3.5–5.1)
SODIUM: 135 mmol/L (ref 135–145)
TOTAL PROTEIN: 7.2 g/dL (ref 6.5–8.1)
Total Bilirubin: 0.5 mg/dL (ref 0.3–1.2)

## 2017-03-13 LAB — I-STAT CG4 LACTIC ACID, ED
Lactic Acid, Venous: 0.76 mmol/L (ref 0.5–1.9)
Lactic Acid, Venous: 0.76 mmol/L (ref 0.5–1.9)

## 2017-03-13 MED ORDER — IOPAMIDOL (ISOVUE-300) INJECTION 61%
INTRAVENOUS | Status: AC
  Administered 2017-03-13: 75 mL
  Filled 2017-03-13: qty 75

## 2017-03-13 MED ORDER — KETOROLAC TROMETHAMINE 30 MG/ML IJ SOLN
30.0000 mg | Freq: Once | INTRAMUSCULAR | Status: AC
  Administered 2017-03-13: 30 mg via INTRAVENOUS
  Filled 2017-03-13: qty 1

## 2017-03-13 NOTE — ED Provider Notes (Signed)
Complains of right-sided facial pain for 3 weeks. Worse with flexing at the waist, improved with sitting up straight. No fever. No rhinorrhea. Pain feels like pressure. She is treated herself with ibuprofen and Augmentin, and Flonase nasal spray with transient partial relief. On exam alert nontoxic HEENT exam no facial asymmetry or swelling. Eyes normal bilateral tympanic membranes normal neck supple, tender cervical lymphadenopathy right side oropharynx is normal   Orlie Dakin, MD 03/13/17 0840

## 2017-03-13 NOTE — ED Notes (Signed)
Patient transported to CT 

## 2017-03-13 NOTE — ED Provider Notes (Signed)
[facility] DEPT Provider Note   CSN: [id number] Arrival date & time: 03/13/17  0750     History   Chief Complaint No chief complaint on file.   HPI Debra Hutchinson is a 46 y.o. female.  HPI   46 year old female presenting complaining of R neck and ear pain.Patient report for the past nearly 3 weeks she has had persistent sharp throbbing pain primarily involving the right side of her face was subsequently in the right maxillary region, with right ear and her right neck.  Patient was seen in the ED 6 days ago with the same complaint. At that time she reported having facial pain and swelling ongoing for 2 weeks. At that time she was found to have cervical adenopathy, right facial swelling is with tenderness to her frontal and maxillary sinus. She was given a course of Augmentin and anti-inflammatory medication along with steroid to treat for acute sinusitis. Patient mentioned she has been taking her medication as prescribed but states she noticed no improvement. She has finished her course of antibiotic.  She reports taking vicodin sometimes does help with her pain. She works third shift and the pain is becoming more and more intolerable. She denies any associate fever, chills, diplopia, hearing changes, tinnitus, nasal drainage, sore throat, dental pain, neck stiffness, arm weakness, focal numbness, chest pain or shortness of breath.  Past Medical History:  Diagnosis Date  . Fibroids   . Heart murmur   . Lymphedema   . Peripheral edema     There are no active problems to display for this patient.   Past Surgical History:  Procedure Laterality Date  . MYOMECTOMY    . UTERINE FIBROID SURGERY      OB History    No data available       Home Medications    Prior to Admission medications   Medication Sig Start Date End Date Taking? Authorizing Provider  albuterol (PROVENTIL HFA;VENTOLIN HFA) 108 (90 BASE) MCG/ACT inhaler Inhale 2 puffs into the lungs every 6 (six) hours  as needed for shortness of breath.    [provider]  amoxicillin (AMOXIL) 500 MG capsule Take 1 capsule (500 mg total) by mouth 2 (two) times daily. 04/01/15   Piepenbrink, Anderson Malta, PA-C  amoxicillin-clavulanate (AUGMENTIN) 875-125 MG tablet Take 1 tablet by mouth every 12 (twelve) hours. 03/08/17   Carmin Muskrat, MD  BEE POLLEN PO Take 1 tablet by mouth daily.    [provider]  ferrous sulfate 325 (65 FE) MG tablet Take 1 tablet (325 mg total) by mouth daily. 04/01/15   Piepenbrink, Anderson Malta, PA-C  furosemide (LASIX) 20 MG tablet Take 1 tablet (20 mg total) by mouth daily. Patient not taking: Reported on 04/01/2015 06/26/13   Gregor Hams, MD  HYDROcodone-homatropine [facility]) 5-1.5 MG/5ML syrup Take 5 mLs by mouth every 6 (six) hours as needed. 04/01/15   Piepenbrink, Anderson Malta, PA-C  Multiple Vitamins-Minerals (MULTIVITAMIN GUMMIES WOMENS PO) Take 1 tablet by mouth daily.    [provider]  phentermine 37.5 MG capsule Take 37.5 mg by mouth every morning.    [provider]    Family History No family history on file.  Social History Social History  Substance Use Topics  . Smoking status: Never Smoker  . Smokeless tobacco: Never Used  . Alcohol use No     Allergies   Patient has no known allergies.   Review of Systems Review of Systems  All other systems reviewed and are negative.  Physical Exam Updated Vital Signs LMP 03/08/2017   Physical Exam  Constitutional: She is oriented to person, place, and time. She appears well-developed and well-nourished. No distress.  Patient is well-appearing in no acute discomfort  HENT:  Head: Atraumatic.  Right Ear: External ear normal.  Left Ear: External ear normal.  Nose: Nose normal.  Mouth/Throat: Oropharynx is clear and moist.  Tenderness to right maxillary sinus on percussion. No overlying skin changes, no signs of preseptal cellulitis or orbital cellulitis. Good dentition no signs of  dental decay or dental pain. No trismus. No TMJ, no evidence of mastoiditis  Eyes: Conjunctivae and EOM are normal. Pupils are equal, round, and reactive to light.  Neck: Normal range of motion. Neck supple.  Tenderness to the right anterolateral neck along the anterior cervical chain without any obvious lymphadenopathy. No bruit noted  Cardiovascular: Normal rate and regular rhythm.   Pulmonary/Chest: Effort normal and breath sounds normal. No stridor.  Lymphadenopathy:    She has no cervical adenopathy.  Neurological: She is alert and oriented to person, place, and time. GCS eye subscore is 4. GCS verbal subscore is 5. GCS motor subscore is 6.  Skin: No rash noted.  Psychiatric: She has a normal mood and affect.  Nursing note and vitals reviewed.    ED Treatments / Results  Labs (all labs ordered are listed, but only abnormal results are displayed) Labs Reviewed  COMPREHENSIVE METABOLIC PANEL - Abnormal; Notable for the following:       Result Value   Potassium 3.3 (*)    Glucose, Bld 104 (*)    Calcium 8.6 (*)    All other components within normal limits  CBC WITH DIFFERENTIAL/PLATELET - Abnormal; Notable for the following:    Hemoglobin 8.8 (*)    HCT 29.8 (*)    MCV 63.7 (*)    MCH 18.8 (*)    MCHC 29.5 (*)    RDW 19.8 (*)    All other components within normal limits  I-STAT CG4 LACTIC ACID, ED  I-STAT BETA HCG BLOOD, ED ([facility], [facility], [facility] ONLY)  I-STAT CG4 LACTIC ACID, ED    EKG  EKG Interpretation None       Radiology Ct Maxillofacial W Contrast  Result Date: 03/13/2017 CLINICAL DATA:  Right sinus pain EXAM: CT MAXILLOFACIAL WITH CONTRAST TECHNIQUE: Multidetector CT imaging of the maxillofacial structures was performed. Multiplanar CT image reconstructions were also generated. A small metallic BB was placed on the right temple in order to reliably differentiate right from left. CONTRAST:  90mL ISOVUE-300 IOPAMIDOL (ISOVUE-300) INJECTION 61% COMPARISON:  None FINDINGS:  Osseous: Negative for fracture or bone lesion. Lucency in the right upper third molar compatible with caries. No periapical abscess is present. Orbits: Orbital soft tissues normal.  No mass or edema. Sinuses: Paranasal sinuses are clear without mucosal edema or air-fluid level. Nasal septum mildly deviated. Soft tissues: No soft tissue swelling in the face. Normal pharyngeal soft tissues. Limited intracranial: Negative IMPRESSION: Lucency right upper third molar compatible with caries. Question dental pain. Negative for abscess. Paranasal sinuses clear Electronically Signed   By: Franchot Gallo M.D.   On: 03/13/2017 10:57    Procedures Procedures (including critical care time)  Medications Ordered in ED Medications  ketorolac (TORADOL) 30 MG/ML injection 30 mg (30 mg Intravenous Given 03/13/17 0957)  iopamidol (ISOVUE-300) 61 % injection (75 mLs  Contrast Given 03/13/17 1005)     Initial Impression / Assessment and Plan / ED Course  I have reviewed  the triage vital signs and the nursing notes.  Pertinent labs & imaging results that were available during my care of the patient were reviewed by me and considered in my medical decision making (see chart for details).     BP (!) 181/93   Pulse 80   Temp 98.1 F (36.7 C) (Oral)   Resp 16   Ht 5\' 9"  (1.753 m)   Wt 117.9 kg (260 lb) Comment: Simultaneous filing. User may not have seen previous data.  LMP 03/08/2017   SpO2 100%   BMI 38.40 kg/m  The patient was noted to be hypertensive today in the emergency department. I have spoken with the patient regarding hypertension and the need for improved management. I instructed the patient to followup with the Primary care doctor within 4 days to improve the management of the patient's hypertension. I also counseled the patient regarding the signs and symptoms which would require an emergent visit to an emergency department for hypertensive urgency and/or hypertensive emergency. The patient  understood the need for improved hypertensive management.   Final Clinical Impressions(s) / ED Diagnoses   Final diagnoses:  Facial pain  Dental caries    New Prescriptions New Prescriptions   No medications on file   8:18 AM Patient here with persistent right maxillary sinus and right ear/neck pain ongoing for 3 weeks. Was treated for acute sinusitis with Augmentin over the week ago without any relief. Patient was concerned. She does have reproducible sinus pain on exam however she is well-appearing. Given the prolonged duration of her symptoms, plan to obtain CT scan of the maxillofacial region for further evaluation. She is afebrile and appears nontoxic.  12:53 PM Laceration. Evidence of anemia with hemoglobin of 8.8 however this is close to her baseline. She has normal lactic acid. Her pregnancy test is negative. Chest normal WBC. Mildly hypokalemic with a potassium of 3.3. Maxillofacial CT scan was obtained showing lucency to the right upper third molar compatible with caries however no abscess. Paranasal sinus is clear. Her pain may be related to dental pain. I encouraged patient to continue taking her Augmentin. She does have a dentist that she can follow-up. I will also give patient a referral to an ENT for further management. At this time I do not have suspicion for vertebral artery dissection since patient does not have any focal neuro deficit and her pain is been ongoing for 3 weeks.     Domenic Moras, PA-C 03/13/17 1303    Orlie Dakin, MD 03/13/17 1650

## 2017-03-13 NOTE — ED Triage Notes (Addendum)
Pt c/o continued R ear and facial pain, reports pain now extends to R neck.  Pt reports not finishing prescribed abx, but taking a total of 7 days of old abx.  Pt denies fevers, cough, nasal drainage, endorses chills.

## 2017-03-13 NOTE — Discharge Instructions (Signed)
You have been evaluated for your facial pain. The CT scan did not show any significant signs of sinusitis at this time. There is however a dental caries in the same area of your pain, which may contribute to your pain. Continue taking Augmentin as previously prescribed.  Take tylenol or ibuprofen as needed for pain.  Follow up with your dentist for further care.  You may also follow up with ENT specialist.  Your blood pressure is high today, please have it recheck by your primary care provider next week.

## 2017-03-13 NOTE — ED Notes (Signed)
ED Provider at bedside. 

## 2017-04-18 DIAGNOSIS — N921 Excessive and frequent menstruation with irregular cycle: Secondary | ICD-10-CM | POA: Diagnosis not present

## 2017-04-18 DIAGNOSIS — D251 Intramural leiomyoma of uterus: Secondary | ICD-10-CM | POA: Diagnosis not present

## 2017-05-01 DIAGNOSIS — Z124 Encounter for screening for malignant neoplasm of cervix: Secondary | ICD-10-CM | POA: Diagnosis not present

## 2017-05-01 DIAGNOSIS — N921 Excessive and frequent menstruation with irregular cycle: Secondary | ICD-10-CM | POA: Diagnosis not present

## 2017-05-01 DIAGNOSIS — D251 Intramural leiomyoma of uterus: Secondary | ICD-10-CM | POA: Diagnosis not present

## 2017-05-01 DIAGNOSIS — N8502 Endometrial intraepithelial neoplasia [EIN]: Secondary | ICD-10-CM | POA: Diagnosis not present

## 2017-05-31 DIAGNOSIS — N920 Excessive and frequent menstruation with regular cycle: Secondary | ICD-10-CM | POA: Diagnosis not present

## 2017-05-31 DIAGNOSIS — N8502 Endometrial intraepithelial neoplasia [EIN]: Secondary | ICD-10-CM | POA: Diagnosis not present

## 2017-05-31 DIAGNOSIS — D251 Intramural leiomyoma of uterus: Secondary | ICD-10-CM | POA: Diagnosis not present

## 2017-06-27 DIAGNOSIS — N801 Endometriosis of ovary: Secondary | ICD-10-CM | POA: Diagnosis not present

## 2017-06-27 DIAGNOSIS — N838 Other noninflammatory disorders of ovary, fallopian tube and broad ligament: Secondary | ICD-10-CM | POA: Diagnosis not present

## 2017-06-27 DIAGNOSIS — Z791 Long term (current) use of non-steroidal anti-inflammatories (NSAID): Secondary | ICD-10-CM | POA: Diagnosis not present

## 2017-06-27 DIAGNOSIS — D259 Leiomyoma of uterus, unspecified: Secondary | ICD-10-CM | POA: Diagnosis not present

## 2017-06-27 DIAGNOSIS — D251 Intramural leiomyoma of uterus: Secondary | ICD-10-CM | POA: Diagnosis not present

## 2017-06-27 DIAGNOSIS — N8502 Endometrial intraepithelial neoplasia [EIN]: Secondary | ICD-10-CM | POA: Diagnosis not present

## 2017-06-27 DIAGNOSIS — N736 Female pelvic peritoneal adhesions (postinfective): Secondary | ICD-10-CM | POA: Diagnosis not present

## 2017-06-27 DIAGNOSIS — N800 Endometriosis of uterus: Secondary | ICD-10-CM | POA: Diagnosis not present

## 2017-06-27 DIAGNOSIS — D62 Acute posthemorrhagic anemia: Secondary | ICD-10-CM | POA: Diagnosis not present

## 2017-06-27 DIAGNOSIS — G8918 Other acute postprocedural pain: Secondary | ICD-10-CM | POA: Diagnosis not present

## 2017-07-10 ENCOUNTER — Other Ambulatory Visit: Payer: Self-pay | Admitting: *Deleted

## 2017-07-10 NOTE — Patient Outreach (Signed)
[facility]) Care Management  07/10/2017  Malie Kashani [birth year] [id number]   Subjective: Telephone call to patient's home  / mobile number, no answer, left HIPAA compliant voicemail message, and requested call back.    Objective: Per KPN (Knowledge Performance Now, point of care tool) and chart review, patient admitted to [facility] on 06/27/17 for Intramural leiomyoma of uterus, Atypical endometrial hyperplasia, Endometriosis of ovary,  Pelvic peritoneal adhesions, and discharge date unknown.     Status post HYSTERECTOMY TOTAL ABDOMINAL WITH BILATERAL SALPINGECTOMY,RIGHT OVARIAN CYSTECTOMY, and LYSIS OF ADHESIONS on 06/27/17.      Assessment: Received UMR Transition of care referral on 06/28/17.  Transition of care follow up pending patient contact.     Plan: RNCM will call patient for 2nd telephone outreach attempt, transition of care follow up, within 10 business days if no return call.     Skyelar Halliday H. Annia Friendly, BSN, [place] Management [facility] Telephonic CM Phone: [phone] Fax: [phone]

## 2017-07-11 ENCOUNTER — Ambulatory Visit: Payer: Self-pay | Admitting: *Deleted

## 2017-07-11 ENCOUNTER — Other Ambulatory Visit: Payer: Self-pay | Admitting: *Deleted

## 2017-07-11 NOTE — Patient Outreach (Signed)
[facility]) Care Management  07/11/2017  Debra Hutchinson [birth year] [id number]  Subjective: Telephone call to patient's home  / mobile number, no answer, left HIPAA compliant voicemail message, and requested call back.    Objective: Per KPN (Knowledge Performance Now, point of care tool) and chart review, patient admitted to [facility] on 06/27/17 for Intramural leiomyoma of uterus, Atypical endometrial hyperplasia, Endometriosis of ovary,  Pelvic peritoneal adhesions, and discharge date unknown.     Status post HYSTERECTOMY TOTAL ABDOMINAL WITH BILATERAL SALPINGECTOMY,RIGHT OVARIAN CYSTECTOMY, and LYSIS OF ADHESIONS on 06/27/17.      Assessment: Received UMR Transition of care referral on 06/28/17.  Transition of care follow up pending patient contact.     Plan: RNCM will call patient for 3rd telephone outreach attempt, transition of care follow up, within 10 business days if no return call.     Jaquita Bessire H. Annia Friendly, BSN, [place] Management [facility] Telephonic CM Phone: [phone] Fax: [phone]

## 2017-07-12 ENCOUNTER — Other Ambulatory Visit: Payer: Self-pay | Admitting: *Deleted

## 2017-07-12 ENCOUNTER — Ambulatory Visit: Payer: Self-pay | Admitting: *Deleted

## 2017-07-12 ENCOUNTER — Encounter: Payer: Self-pay | Admitting: *Deleted

## 2017-07-12 NOTE — Patient Outreach (Signed)
[facility]) Care Management  07/12/2017  Davan Hark [birth year] [id number]   Subjective:Telephone call to patient's home / mobile number, no answer, left HIPAA compliant voicemail message, and requested call back.    Objective:Per KPN (Knowledge Performance Now, point of care tool) and chart review,patient admitted to [facility] on 06/27/17 forIntramural leiomyoma of uterus,Atypical endometrial hyperplasia,Endometriosis of ovary,Pelvic peritoneal adhesions, and discharge date unknown. Status post HYSTERECTOMY TOTAL ABDOMINAL WITH BILATERAL SALPINGECTOMY,RIGHT OVARIAN CYSTECTOMY,andLYSIS OF ADHESIONSon 06/27/17.     Assessment: Received UMR Transition of care referral on 06/28/17.Transition of care follow up pending patient contact.    Plan:RNCM will send unsuccessful outreach  letter, [facility] pamphlet, and proceed with case closure, within 10 business days if no return call.     Geordie Nooney H. Annia Friendly, BSN, [place] Management [facility] Telephonic CM Phone: [phone] Fax: [phone]

## 2017-07-29 ENCOUNTER — Other Ambulatory Visit: Payer: Self-pay | Admitting: *Deleted

## 2017-07-29 NOTE — Patient Outreach (Signed)
[facility]) Care Management  07/29/2017  Debra Hutchinson Feb 09, 1971 [id number]   No response from patient outreach attempts will proceed with case closure.     Objective:Per KPN (Knowledge Performance Now, point of care tool) and chart review,patient admitted to [facility] on 06/27/17 forIntramural leiomyoma of uterus,Atypical endometrial hyperplasia,Endometriosis of ovary,Pelvic peritoneal adhesions, and discharge date unknown. Status post HYSTERECTOMY TOTAL ABDOMINAL WITH BILATERAL SALPINGECTOMY,RIGHT OVARIAN CYSTECTOMY,andLYSIS OF ADHESIONSon 06/27/17.     Assessment: Received UMR Transition of care referral on 06/28/17.Transition of care follow up not completed due to unable to contact patient and will proceed with case closure.     Plan:RNCM will send case closure due to unable to reach request to [name].     Debra Hutchinson H. Annia Friendly, BSN, [place] Management [facility] Telephonic CM Phone: [phone] Fax: [phone]

## 2018-02-13 DIAGNOSIS — M25561 Pain in right knee: Secondary | ICD-10-CM | POA: Diagnosis not present

## 2018-02-18 DIAGNOSIS — J209 Acute bronchitis, unspecified: Secondary | ICD-10-CM | POA: Diagnosis not present

## 2018-02-18 DIAGNOSIS — J9801 Acute bronchospasm: Secondary | ICD-10-CM | POA: Diagnosis not present

## 2018-02-27 DIAGNOSIS — M25561 Pain in right knee: Secondary | ICD-10-CM | POA: Diagnosis not present

## 2018-03-04 DIAGNOSIS — J209 Acute bronchitis, unspecified: Secondary | ICD-10-CM | POA: Diagnosis not present

## 2018-03-04 DIAGNOSIS — J9801 Acute bronchospasm: Secondary | ICD-10-CM | POA: Diagnosis not present

## 2018-03-11 DIAGNOSIS — R6889 Other general symptoms and signs: Secondary | ICD-10-CM | POA: Diagnosis not present

## 2018-03-11 DIAGNOSIS — M25561 Pain in right knee: Secondary | ICD-10-CM | POA: Diagnosis not present

## 2018-04-02 DIAGNOSIS — M25561 Pain in right knee: Secondary | ICD-10-CM | POA: Diagnosis not present

## 2018-04-09 DIAGNOSIS — M25561 Pain in right knee: Secondary | ICD-10-CM | POA: Diagnosis not present

## 2018-04-14 DIAGNOSIS — M25561 Pain in right knee: Secondary | ICD-10-CM | POA: Diagnosis not present

## 2018-04-14 DIAGNOSIS — S83206A Unspecified tear of unspecified meniscus, current injury, right knee, initial encounter: Secondary | ICD-10-CM | POA: Diagnosis not present

## 2018-11-06 IMAGING — CT CT MAXILLOFACIAL W/ CM
3 series · 16 of 47 positions shown, 19 images · IV contrast (APPLIED)
Comparison: None

CLINICAL DATA: Right sinus pain

EXAM:
CT MAXILLOFACIAL WITH CONTRAST
TECHNIQUE: Multidetector CT imaging of the maxillofacial structures was
performed. Multiplanar CT image reconstructions were also generated.
A small metallic BB was placed on the right temple in order to
reliably differentiate right from left.
CONTRAST:  75mL QENZIY-K55 IOPAMIDOL (QENZIY-K55) INJECTION 61%

[Series 3: facial/orbits w 2.0 st · axial · 0.33mm/px · z∈[-207,-57]mm · 10 of 88 slices shown, 13 images]
[im 7/88  brain]
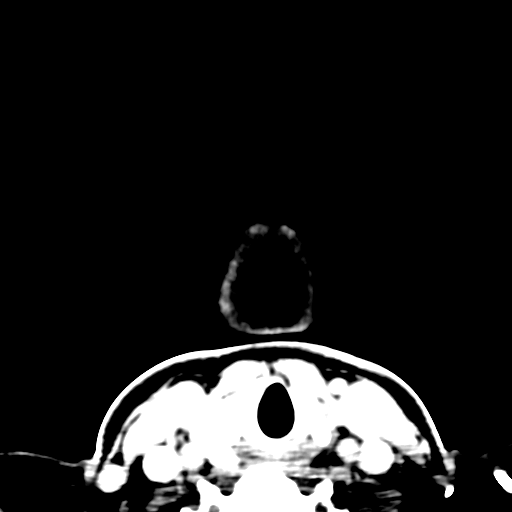
[im 7/88  bone]
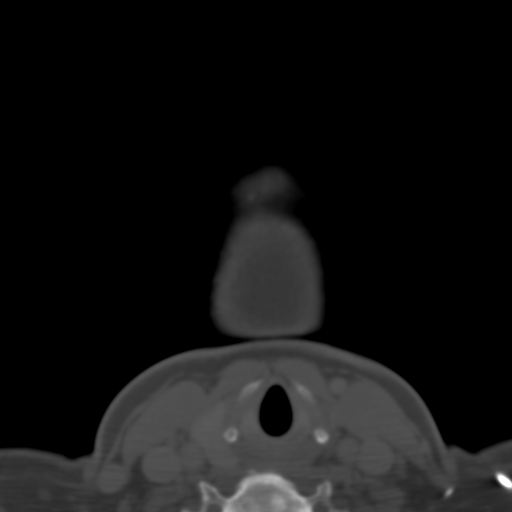
[im 16/88  bone]
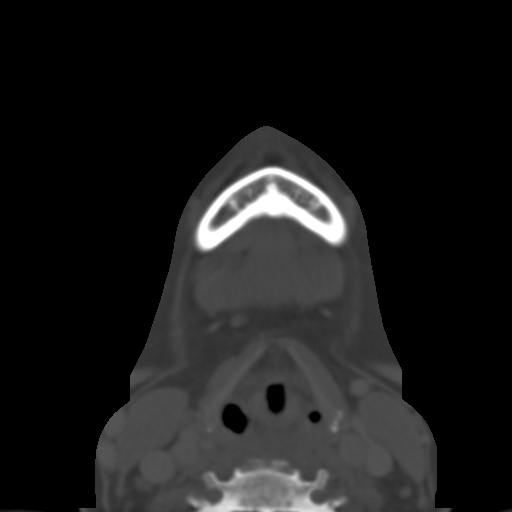
[im 25/88  bone]
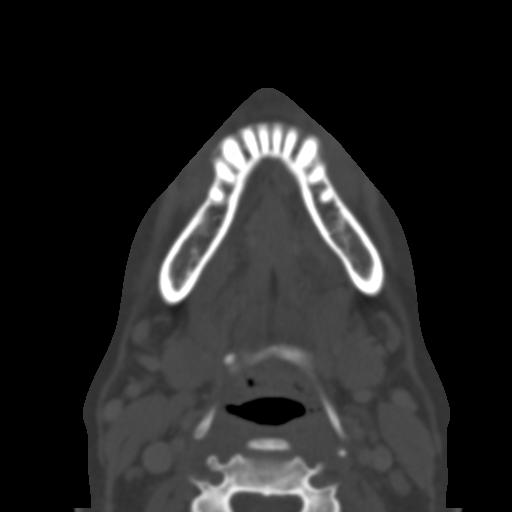
[im 31/88  bone]
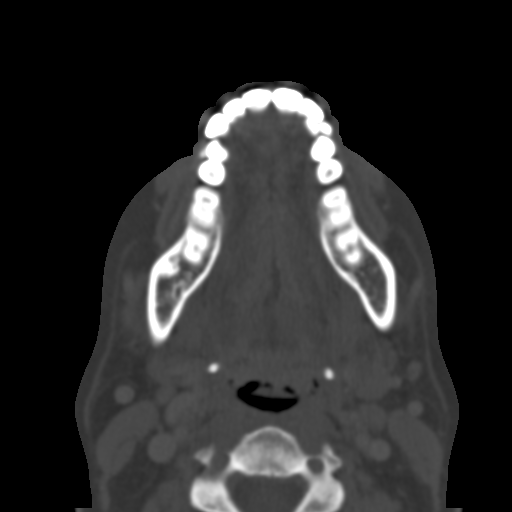
[im 40/88  brain]
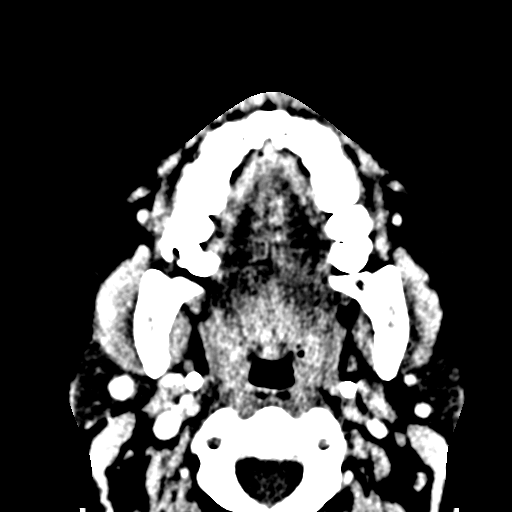
[im 40/88  bone]
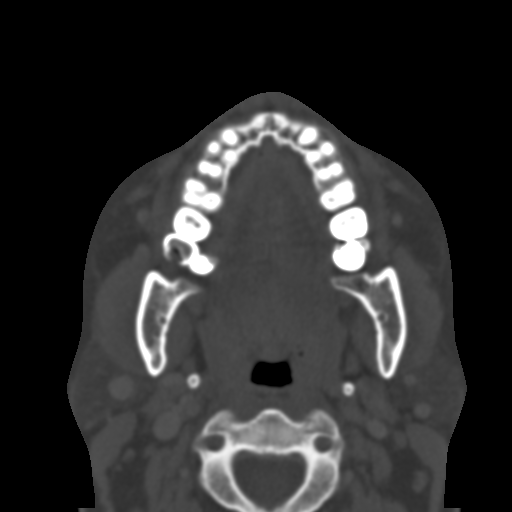
[im 49/88  bone]
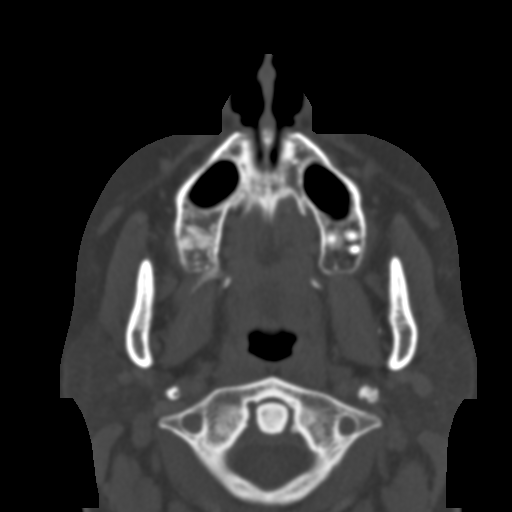
[im 58/88  bone]
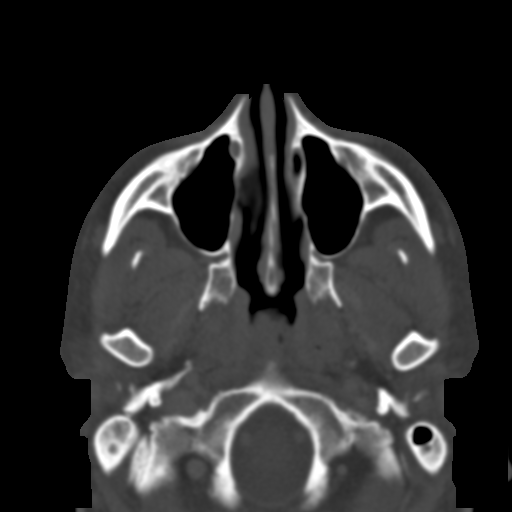
[im 67/88  bone]
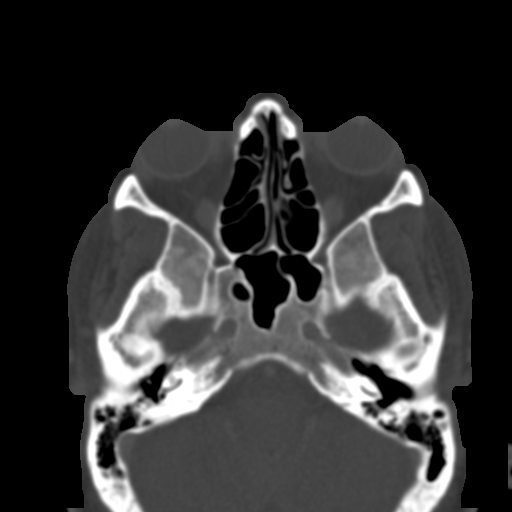
[im 73/88  brain]
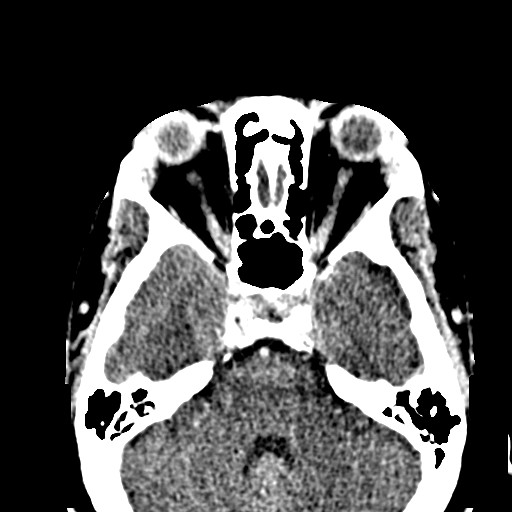
[im 73/88  bone]
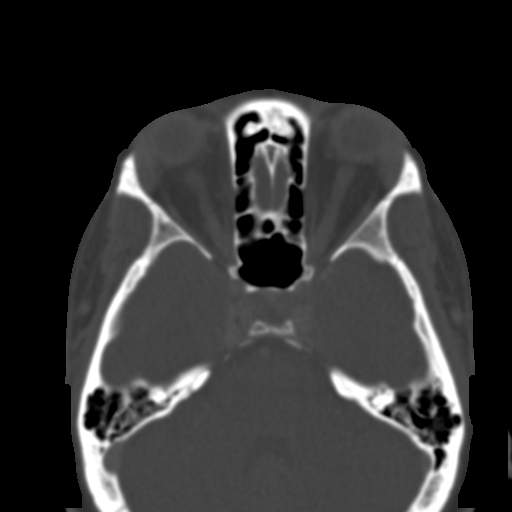
[im 82/88  bone]
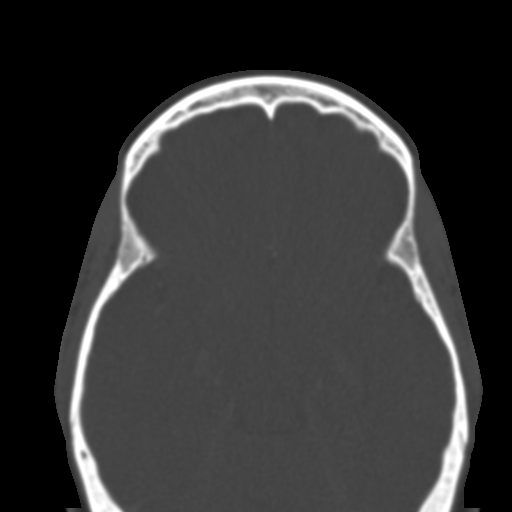

[Series 7: coronal soft tissue · coronal · 0.36mm/px · 3 of 87 slices shown]
[im 29/87  bone]
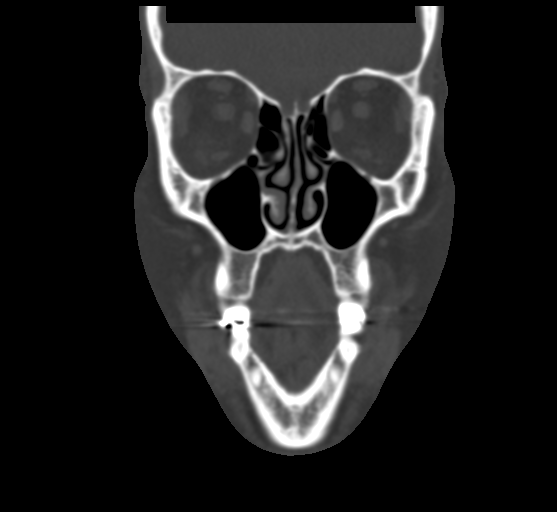
[im 39/87  bone]
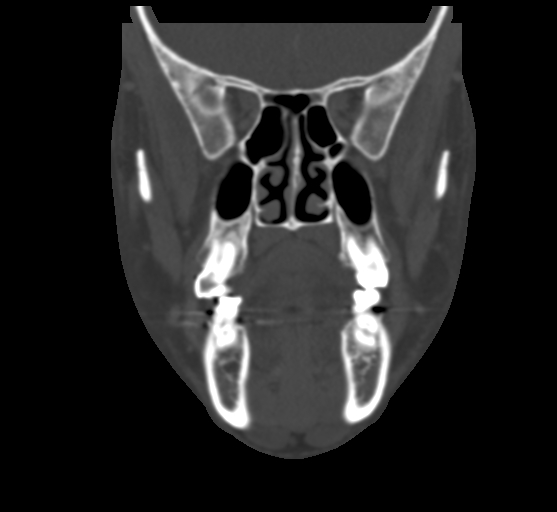
[im 48/87  bone]
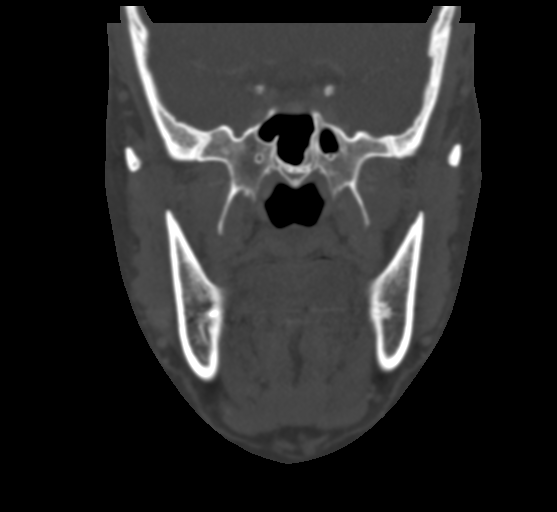

[Series 8: sagittal soft tissue · sagittal · 0.35mm/px · 3 of 87 slices shown]
[im 29/87  bone]
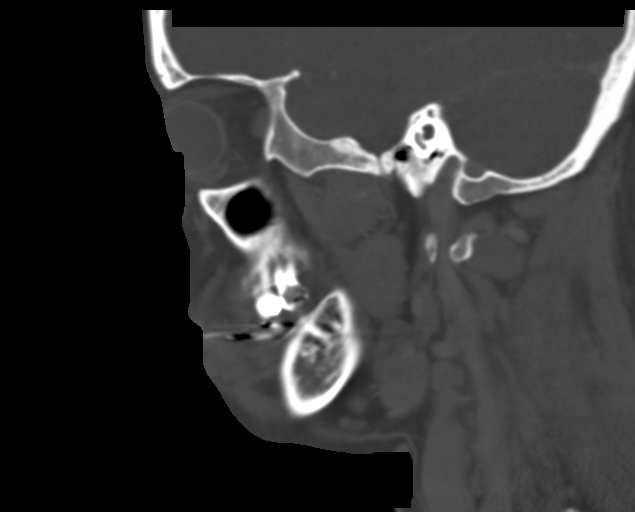
[im 44/87  bone]
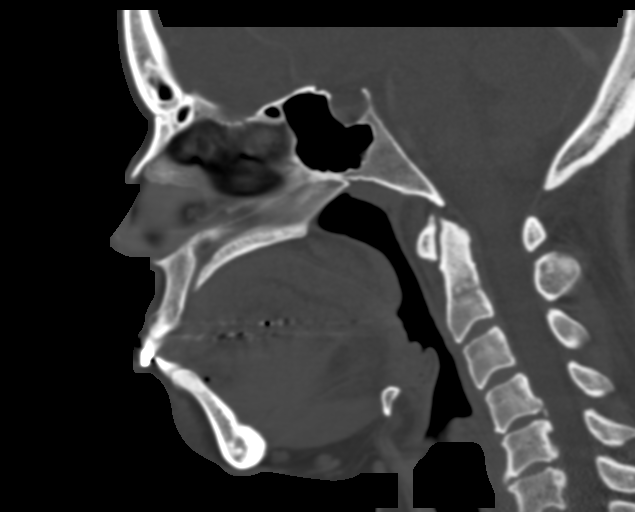
[im 58/87  bone]
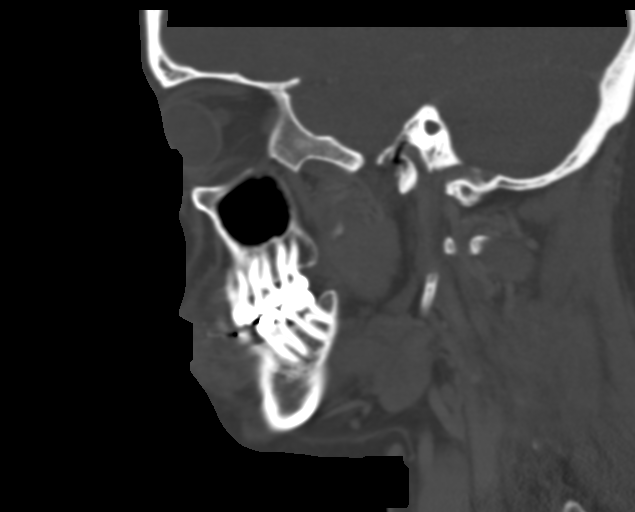

[16 of 47 positions shown; findings below may reference images not displayed]

FINDINGS: Osseous: Negative for fracture or bone lesion. Lucency in the right
upper third molar compatible with caries. No periapical abscess is
present.

Orbits: Orbital soft tissues normal.  No mass or edema.

Sinuses: Paranasal sinuses are clear without mucosal edema or
air-fluid level. Nasal septum mildly deviated.

Soft tissues: No soft tissue swelling in the face. Normal pharyngeal
soft tissues.

Limited intracranial: Negative
IMPRESSION: Lucency right upper third molar compatible with caries. Question
dental pain. Negative for abscess.

Paranasal sinuses clear

## 2019-08-10 ENCOUNTER — Ambulatory Visit (INDEPENDENT_AMBULATORY_CARE_PROVIDER_SITE_OTHER): Payer: 59

## 2019-08-10 ENCOUNTER — Ambulatory Visit (HOSPITAL_COMMUNITY)
Admission: EM | Admit: 2019-08-10 | Discharge: 2019-08-10 | Disposition: A | Discharge status: Home / Self Care | Payer: 59 | Attending: Emergency Medicine | Admitting: Emergency Medicine

## 2019-08-10 ENCOUNTER — Other Ambulatory Visit: Payer: Self-pay

## 2019-08-10 ENCOUNTER — Encounter (HOSPITAL_COMMUNITY): Payer: Self-pay

## 2019-08-10 DIAGNOSIS — U071 COVID-19: Secondary | ICD-10-CM | POA: Diagnosis not present

## 2019-08-10 DIAGNOSIS — R05 Cough: Secondary | ICD-10-CM | POA: Diagnosis not present

## 2019-08-10 NOTE — ED Triage Notes (Signed)
Pt presents with severe shortness of breath; pt states she is not able to do small tasks without losing her breath.  Pt tested positive for covid Friday 08/07/2019

## 2019-08-10 NOTE — Discharge Instructions (Signed)
Your chest xray looks fair today, it does not change course of treatment at this time.  It is reassuring that your oxygen looks well also.  Continue to isolate.  Continue to rest.  Push fluids. Small frequent sips of fluids- Pedialyte, Gatorade, water, broth- to maintain hydration.   Tylenol and ibuprofen to help with fever and aches.  If any worsening of breathing or hydration please go to the ER.

## 2019-08-10 NOTE — ED Provider Notes (Signed)
[facility]    CSN: [id number] Arrival date & time: 08/10/19  1527      History   Chief Complaint Chief Complaint  Patient presents with  . Positive Covid  . Shortness of Breath    HPI Debra Hutchinson is a 48 y.o. female.   Debra Hutchinson presents with complaints of shortness of breath. 1 week ago while at work developed a small cough/ throat clearing. covid tested 12/2, symptoms worsened. Tested positive for covid-19.  Cough over the past few days. senesation of feeling like "inhaled something" a burning sensation to throat. Today with shortness of breath on exertion. Taking deep breath causes cough. Severe headache. Cough was worse today. This morning cough is productive. Fever has been around 99 but today up to 101 all day. Tylenol last at 2p this afternoon. No other medications. Has been alternating with ibuprofen but hasn't taken today. Decreased intake and appetite. No gi symptoms although stomach is a bit crampy. No loss of taste or smell. No sore throat. Body aches. No ibuprofen. Works as a Marine scientist at [employer]. No known ill contacts either at work or at home. Patient lives alone. Denies asthma history but with seasonal bronchitis.     ROS per HPI, negative if not otherwise mentioned.      Past Medical History:  Diagnosis Date  . Fibroids   . Heart murmur   . Lymphedema   . Peripheral edema     There are no active problems to display for this patient.   Past Surgical History:  Procedure Laterality Date  . MYOMECTOMY    . UTERINE FIBROID SURGERY      OB History   No obstetric history on file.      Home Medications    Prior to Admission medications   Medication Sig Start Date End Date Taking? Authorizing Provider  albuterol (PROVENTIL HFA;VENTOLIN HFA) 108 (90 BASE) MCG/ACT inhaler Inhale 2 puffs into the lungs every 6 (six) hours as needed for shortness of breath.    [provider]  amoxicillin (AMOXIL) 500 MG capsule  Take 1 capsule (500 mg total) by mouth 2 (two) times daily. 04/01/15   Piepenbrink, Anderson Malta, PA-C  amoxicillin-clavulanate (AUGMENTIN) 875-125 MG tablet Take 1 tablet by mouth every 12 (twelve) hours. 03/08/17   Carmin Muskrat, MD  BEE POLLEN PO Take 1 tablet by mouth daily.    [provider]  ferrous sulfate 325 (65 FE) MG tablet Take 1 tablet (325 mg total) by mouth daily. 04/01/15   Piepenbrink, Anderson Malta, PA-C  furosemide (LASIX) 20 MG tablet Take 1 tablet (20 mg total) by mouth daily. Patient not taking: Reported on 04/01/2015 06/26/13   Gregor Hams, MD  HYDROcodone-homatropine [facility]) 5-1.5 MG/5ML syrup Take 5 mLs by mouth every 6 (six) hours as needed. 04/01/15   Piepenbrink, Anderson Malta, PA-C  Multiple Vitamins-Minerals (MULTIVITAMIN GUMMIES WOMENS PO) Take 1 tablet by mouth daily.    [provider]  phentermine 37.5 MG capsule Take 37.5 mg by mouth every morning.    [provider]    Family History Family History  Family history unknown: Yes    Social History Social History   Tobacco Use  . Smoking status: Never Smoker  . Smokeless tobacco: Never Used  Substance Use Topics  . Alcohol use: No  . Drug use: No     Allergies   Patient has no known allergies.   Review of Systems Review of Systems   Physical Exam Triage  Vital Signs ED Triage Vitals  Enc Vitals Group     BP 08/10/19 1633 (!) 159/93     Pulse Rate 08/10/19 1633 (!) 109     Resp 08/10/19 1633 (!) 24     Temp 08/10/19 1633 (!) 101.1 F (38.4 C)     Temp Source 08/10/19 1633 Oral     SpO2 08/10/19 1633 98 %     Weight --      Height --      Head Circumference --      Peak Flow --      Pain Score 08/10/19 1634 3     Pain Loc --      Pain Edu? --      Excl. in [place]? --    No data found.  Updated Vital Signs BP (!) 159/93 (BP Location: Left Arm)   Pulse (!) 109   Temp (!) 101.1 F (38.4 C) (Oral) Comment: took tylenol 2 hours ago  Resp (!) 26   LMP 03/08/2017   SpO2  98%   Visual Acuity Right Eye Distance:   Left Eye Distance:   Bilateral Distance:    Right Eye Near:   Left Eye Near:    Bilateral Near:     Physical Exam Constitutional:      General: She is not in acute distress.    Appearance: She is well-developed.  Cardiovascular:     Rate and Rhythm: Normal rate.  Pulmonary:     Effort: Pulmonary effort is normal. Tachypnea present. No accessory muscle usage or respiratory distress.  Skin:    General: Skin is warm and dry.  Neurological:     Mental Status: She is alert and oriented to person, place, and time.      UC Treatments / Results  Labs (all labs ordered are listed, but only abnormal results are displayed) Labs Reviewed - No data to display  EKG   Radiology Dg Chest 2 View  Result Date: 08/10/2019 CLINICAL DATA:  Dyspnea, cough. EXAM: CHEST - 2 VIEW COMPARISON:  April 01, 2015. FINDINGS: The heart size and mediastinal contours are within normal limits. No pneumothorax is noted. Mild bibasilar subsegmental atelectasis is noted with probable small pleural effusions. The visualized skeletal structures are unremarkable. IMPRESSION: Mild bibasilar subsegmental atelectasis is noted with probable small pleural effusions. Electronically Signed   By: Marijo Conception M.D.   On: 08/10/2019 17:07    Procedures Procedures (including critical care time)  Medications Ordered in UC Medications - No data to display  Initial Impression / Assessment and Plan / UC Course  I have reviewed the triage vital signs and the nursing notes.  Pertinent labs & imaging results that were available during my care of the patient were reviewed by me and considered in my medical decision making (see chart for details).     Patient endorses feeling much improved by the time I am in room for discussion. No increased work of breathing, speaking in long, clear sentences without difficulty. No cough throughout exam. She states she has started to sweat as if  fever has broke. Chest xray is fair. No hypoxia. Known covid-19 infection, day 7. Continue with supportive cares. Return precautions provided and ER precautions discussed. Patient verbalized understanding and agreeable to plan.   Ambulatory out of clinic without difficulty.   Final Clinical Impressions(s) / UC Diagnoses   Final diagnoses:  [id number]     Discharge Instructions     Your chest xray looks fair today,  it does not change course of treatment at this time.  It is reassuring that your oxygen looks well also.  Continue to isolate.  Continue to rest.  Push fluids. Small frequent sips of fluids- Pedialyte, Gatorade, water, broth- to maintain hydration.   Tylenol and ibuprofen to help with fever and aches.  If any worsening of breathing or hydration please go to the ER.     ED Prescriptions    None     PDMP not reviewed this encounter.   Zigmund Gottron, NP 08/10/19 1739

## 2021-02-25 DIAGNOSIS — Z20822 Contact with and (suspected) exposure to covid-19: Secondary | ICD-10-CM | POA: Diagnosis not present

## 2021-03-07 DIAGNOSIS — R7303 Prediabetes: Secondary | ICD-10-CM | POA: Diagnosis not present

## 2021-04-17 ENCOUNTER — Other Ambulatory Visit (HOSPITAL_COMMUNITY): Payer: Self-pay

## 2021-04-17 DIAGNOSIS — Z760 Encounter for issue of repeat prescription: Secondary | ICD-10-CM | POA: Diagnosis not present

## 2021-04-17 DIAGNOSIS — R635 Abnormal weight gain: Secondary | ICD-10-CM | POA: Diagnosis not present

## 2021-04-17 MED ORDER — OZEMPIC (0.25 OR 0.5 MG/DOSE) 2 MG/1.5ML ~~LOC~~ SOPN
0.5000 mg | PEN_INJECTOR | SUBCUTANEOUS | 0 refills | Status: AC
  Filled 2021-04-17: qty 3, 56d supply, fill #0

## 2021-10-06 DIAGNOSIS — Z6841 Body Mass Index (BMI) 40.0 and over, adult: Secondary | ICD-10-CM | POA: Diagnosis not present

## 2021-10-06 DIAGNOSIS — R635 Abnormal weight gain: Secondary | ICD-10-CM | POA: Diagnosis not present

## 2021-10-09 ENCOUNTER — Other Ambulatory Visit (HOSPITAL_COMMUNITY): Payer: Self-pay

## 2021-10-09 MED ORDER — OZEMPIC (0.25 OR 0.5 MG/DOSE) 2 MG/1.5ML ~~LOC~~ SOPN
0.5000 mg | PEN_INJECTOR | SUBCUTANEOUS | 0 refills | Status: AC
  Filled 2021-10-09: qty 3, 56d supply, fill #0

## 2021-10-10 ENCOUNTER — Other Ambulatory Visit (HOSPITAL_COMMUNITY): Payer: Self-pay

## 2021-11-17 DIAGNOSIS — R7303 Prediabetes: Secondary | ICD-10-CM | POA: Diagnosis not present

## 2021-11-17 DIAGNOSIS — R635 Abnormal weight gain: Secondary | ICD-10-CM | POA: Diagnosis not present

## 2021-11-17 DIAGNOSIS — Z6841 Body Mass Index (BMI) 40.0 and over, adult: Secondary | ICD-10-CM | POA: Diagnosis not present

## 2021-11-20 ENCOUNTER — Other Ambulatory Visit (HOSPITAL_COMMUNITY): Payer: Self-pay

## 2021-11-20 MED ORDER — OZEMPIC (1 MG/DOSE) 4 MG/3ML ~~LOC~~ SOPN
1.0000 mg | PEN_INJECTOR | SUBCUTANEOUS | 0 refills | Status: AC
  Filled 2021-11-20 – 2021-11-30 (×2): qty 3, 28d supply, fill #0

## 2021-11-28 ENCOUNTER — Other Ambulatory Visit (HOSPITAL_COMMUNITY): Payer: Self-pay

## 2021-11-30 ENCOUNTER — Other Ambulatory Visit (HOSPITAL_COMMUNITY): Payer: Self-pay

## 2022-08-30 DIAGNOSIS — M25561 Pain in right knee: Secondary | ICD-10-CM | POA: Diagnosis not present

## 2022-08-30 DIAGNOSIS — M25562 Pain in left knee: Secondary | ICD-10-CM | POA: Diagnosis not present

## 2023-09-18 DIAGNOSIS — M170 Bilateral primary osteoarthritis of knee: Secondary | ICD-10-CM | POA: Diagnosis not present

## 2024-01-30 DIAGNOSIS — M170 Bilateral primary osteoarthritis of knee: Secondary | ICD-10-CM | POA: Diagnosis not present
